# Patient Record
Sex: Male | Born: 1950 | Race: White | Hispanic: No | Smoking: Never smoker
Health system: Southern US, Community
[De-identification: ages and names within clinical notes are randomized; demographics above are authoritative.]

## PROBLEM LIST (undated history)

## (undated) DIAGNOSIS — M109 Gout, unspecified: Secondary | ICD-10-CM

## (undated) DIAGNOSIS — I10 Essential (primary) hypertension: Secondary | ICD-10-CM

## (undated) DIAGNOSIS — I4891 Unspecified atrial fibrillation: Secondary | ICD-10-CM

## (undated) DIAGNOSIS — K5900 Constipation, unspecified: Secondary | ICD-10-CM

## (undated) HISTORY — PX: HIP SURGERY: SHX245

## (undated) HISTORY — PX: APPENDECTOMY: SHX54

---

## 1997-12-26 ENCOUNTER — Encounter: Payer: Self-pay | Admitting: Emergency Medicine

## 1997-12-26 ENCOUNTER — Observation Stay (HOSPITAL_COMMUNITY): Admission: EM | Admit: 1997-12-26 | Discharge: 1997-12-26 | Payer: Self-pay | Admitting: Emergency Medicine

## 1998-07-10 ENCOUNTER — Encounter: Payer: Self-pay | Admitting: Radiology

## 1998-07-10 ENCOUNTER — Encounter: Payer: Self-pay | Admitting: Orthopedic Surgery

## 1998-07-10 ENCOUNTER — Observation Stay (HOSPITAL_COMMUNITY): Admission: EM | Admit: 1998-07-10 | Discharge: 1998-07-11 | Payer: Self-pay | Admitting: Emergency Medicine

## 1998-07-11 ENCOUNTER — Encounter: Payer: Self-pay | Admitting: Emergency Medicine

## 1999-01-07 ENCOUNTER — Encounter: Payer: Self-pay | Admitting: Orthopedic Surgery

## 1999-01-14 ENCOUNTER — Inpatient Hospital Stay (HOSPITAL_COMMUNITY): Admission: RE | Admit: 1999-01-14 | Discharge: 1999-01-16 | Payer: Self-pay | Admitting: Orthopedic Surgery

## 1999-01-14 ENCOUNTER — Encounter: Payer: Self-pay | Admitting: Orthopedic Surgery

## 1999-04-22 ENCOUNTER — Encounter: Payer: Self-pay | Admitting: Orthopedic Surgery

## 1999-04-29 ENCOUNTER — Inpatient Hospital Stay (HOSPITAL_COMMUNITY): Admission: RE | Admit: 1999-04-29 | Discharge: 1999-04-30 | Payer: Self-pay | Admitting: Orthopedic Surgery

## 2000-11-14 ENCOUNTER — Encounter: Payer: Self-pay | Admitting: Orthopedic Surgery

## 2000-11-14 ENCOUNTER — Encounter: Payer: Self-pay | Admitting: Emergency Medicine

## 2000-11-14 ENCOUNTER — Emergency Department (HOSPITAL_COMMUNITY): Admission: EM | Admit: 2000-11-14 | Discharge: 2000-11-14 | Payer: Self-pay | Admitting: Emergency Medicine

## 2003-06-26 ENCOUNTER — Encounter: Admission: RE | Admit: 2003-06-26 | Discharge: 2003-06-26 | Payer: Self-pay | Admitting: Emergency Medicine

## 2003-07-08 ENCOUNTER — Encounter: Admission: RE | Admit: 2003-07-08 | Discharge: 2003-07-08 | Payer: Self-pay | Admitting: Emergency Medicine

## 2004-02-03 ENCOUNTER — Ambulatory Visit (HOSPITAL_COMMUNITY): Admission: RE | Admit: 2004-02-03 | Discharge: 2004-02-03 | Payer: Self-pay | Admitting: *Deleted

## 2004-02-28 ENCOUNTER — Ambulatory Visit: Payer: Self-pay | Admitting: Internal Medicine

## 2004-09-09 ENCOUNTER — Ambulatory Visit (HOSPITAL_COMMUNITY): Admission: EM | Admit: 2004-09-09 | Discharge: 2004-09-09 | Payer: Self-pay | Admitting: Emergency Medicine

## 2006-01-25 ENCOUNTER — Encounter: Admission: RE | Admit: 2006-01-25 | Discharge: 2006-01-25 | Payer: Self-pay | Admitting: Emergency Medicine

## 2006-03-08 ENCOUNTER — Encounter: Admission: RE | Admit: 2006-03-08 | Discharge: 2006-03-08 | Payer: Self-pay | Admitting: Interventional Radiology

## 2006-03-15 ENCOUNTER — Encounter: Admission: RE | Admit: 2006-03-15 | Discharge: 2006-03-15 | Payer: Self-pay | Admitting: Interventional Radiology

## 2006-04-01 ENCOUNTER — Encounter: Admission: RE | Admit: 2006-04-01 | Discharge: 2006-04-01 | Payer: Self-pay | Admitting: Interventional Radiology

## 2007-03-21 ENCOUNTER — Ambulatory Visit: Payer: Self-pay | Admitting: Internal Medicine

## 2007-03-28 ENCOUNTER — Ambulatory Visit: Payer: Self-pay

## 2007-03-28 ENCOUNTER — Encounter: Payer: Self-pay | Admitting: Internal Medicine

## 2007-04-28 ENCOUNTER — Ambulatory Visit: Payer: Self-pay | Admitting: Internal Medicine

## 2008-08-09 DIAGNOSIS — I517 Cardiomegaly: Secondary | ICD-10-CM

## 2008-08-09 DIAGNOSIS — I10 Essential (primary) hypertension: Secondary | ICD-10-CM | POA: Insufficient documentation

## 2008-08-09 DIAGNOSIS — I4891 Unspecified atrial fibrillation: Secondary | ICD-10-CM | POA: Insufficient documentation

## 2008-08-22 ENCOUNTER — Ambulatory Visit: Payer: Self-pay | Admitting: Internal Medicine

## 2009-01-27 ENCOUNTER — Telehealth: Payer: Self-pay | Admitting: Internal Medicine

## 2009-02-05 ENCOUNTER — Ambulatory Visit: Payer: Self-pay | Admitting: Cardiovascular Disease

## 2009-02-05 ENCOUNTER — Ambulatory Visit: Payer: Self-pay | Admitting: Internal Medicine

## 2009-02-05 ENCOUNTER — Ambulatory Visit (HOSPITAL_COMMUNITY): Admission: RE | Admit: 2009-02-05 | Discharge: 2009-02-05 | Payer: Self-pay | Admitting: Internal Medicine

## 2009-02-05 ENCOUNTER — Ambulatory Visit: Payer: Self-pay | Admitting: Cardiology

## 2009-02-05 ENCOUNTER — Encounter: Payer: Self-pay | Admitting: Internal Medicine

## 2009-02-05 ENCOUNTER — Telehealth (INDEPENDENT_AMBULATORY_CARE_PROVIDER_SITE_OTHER): Payer: Self-pay | Admitting: Radiology

## 2009-02-05 ENCOUNTER — Ambulatory Visit: Payer: Self-pay

## 2009-02-05 DIAGNOSIS — R0989 Other specified symptoms and signs involving the circulatory and respiratory systems: Secondary | ICD-10-CM

## 2009-02-05 DIAGNOSIS — R0609 Other forms of dyspnea: Secondary | ICD-10-CM

## 2009-02-05 DIAGNOSIS — R0602 Shortness of breath: Secondary | ICD-10-CM

## 2009-02-06 ENCOUNTER — Ambulatory Visit: Payer: Self-pay

## 2009-02-06 ENCOUNTER — Ambulatory Visit: Payer: Self-pay | Admitting: Internal Medicine

## 2009-02-06 ENCOUNTER — Encounter (HOSPITAL_COMMUNITY): Admission: RE | Admit: 2009-02-06 | Discharge: 2009-04-01 | Payer: Self-pay | Admitting: Internal Medicine

## 2009-02-11 ENCOUNTER — Telehealth: Payer: Self-pay | Admitting: Internal Medicine

## 2009-02-13 ENCOUNTER — Telehealth: Payer: Self-pay | Admitting: Internal Medicine

## 2009-02-18 ENCOUNTER — Telehealth (INDEPENDENT_AMBULATORY_CARE_PROVIDER_SITE_OTHER): Payer: Self-pay | Admitting: *Deleted

## 2009-02-24 ENCOUNTER — Encounter: Payer: Self-pay | Admitting: Cardiology

## 2009-03-03 ENCOUNTER — Encounter: Payer: Self-pay | Admitting: Internal Medicine

## 2009-03-19 ENCOUNTER — Telehealth: Payer: Self-pay | Admitting: Internal Medicine

## 2009-03-20 ENCOUNTER — Ambulatory Visit (HOSPITAL_COMMUNITY): Admission: RE | Admit: 2009-03-20 | Discharge: 2009-03-20 | Payer: Self-pay | Admitting: Internal Medicine

## 2009-03-24 ENCOUNTER — Ambulatory Visit (HOSPITAL_COMMUNITY): Admission: RE | Admit: 2009-03-24 | Discharge: 2009-03-24 | Payer: Self-pay | Admitting: Internal Medicine

## 2009-03-24 ENCOUNTER — Ambulatory Visit: Payer: Self-pay | Admitting: Internal Medicine

## 2009-03-24 ENCOUNTER — Encounter: Payer: Self-pay | Admitting: Internal Medicine

## 2009-04-01 ENCOUNTER — Telehealth: Payer: Self-pay | Admitting: Internal Medicine

## 2009-04-07 ENCOUNTER — Ambulatory Visit: Payer: Self-pay | Admitting: Internal Medicine

## 2009-04-08 ENCOUNTER — Telehealth (INDEPENDENT_AMBULATORY_CARE_PROVIDER_SITE_OTHER): Payer: Self-pay | Admitting: *Deleted

## 2009-04-23 ENCOUNTER — Encounter: Payer: Self-pay | Admitting: Internal Medicine

## 2009-04-23 ENCOUNTER — Ambulatory Visit: Payer: Self-pay | Admitting: Internal Medicine

## 2010-02-05 ENCOUNTER — Encounter (INDEPENDENT_AMBULATORY_CARE_PROVIDER_SITE_OTHER): Payer: Self-pay | Admitting: *Deleted

## 2010-05-03 LAB — CONVERTED CEMR LAB
BUN: 16 mg/dL (ref 6–23)
CO2: 26 meq/L (ref 19–32)
Calcium: 10.1 mg/dL (ref 8.4–10.5)
Chloride: 106 meq/L (ref 96–112)
Creatinine, Ser: 0.89 mg/dL (ref 0.40–1.50)
Pro B Natriuretic peptide (BNP): 198 pg/mL — ABNORMAL HIGH (ref 0.0–100.0)

## 2010-05-05 NOTE — Miscellaneous (Signed)
  Clinical Lists Changes  Medications: Rx of LOSARTAN POTASSIUM 50 MG TABS (LOSARTAN POTASSIUM) once daily;  #90 x 4;  Signed;  Entered by: Duncan Dull, RN, BSN;  Authorized by: Nathen May, MD, [facility];  Method used: Electronically to [facility]*, , ,   , Ph: [phone], Fax: [phone]    Prescriptions: LOSARTAN POTASSIUM 50 MG TABS (LOSARTAN POTASSIUM) once daily  #90 x 4   Entered by:   Duncan Dull, RN, BSN   Authorized by:   Nathen May, MD, [facility]   Signed by:   Duncan Dull, RN, BSN on 04/07/2009   Method used:   Electronically to        [facility]* (mail-order)             ,          Ph: [phone]       Fax: [phone]   RxID:   [id number]

## 2010-05-05 NOTE — Assessment & Plan Note (Signed)
Summary: **SK** EKG (786.05)/ MMR  Nurse Visit   Vital Signs:  Patient profile:   61 year old male Weight:      233 pounds BMI:     31.71 Pulse rhythm:   irregular  Impression & Recommendations:  Problem # 1:  FIBRILLATION, ATRIAL (ICD-427.31)  His updated medication list for this problem includes:    Flecainide Acetate 100 Mg Tabs (Flecainide acetate) .Marland Kitchen... Take two times a day starting 3 days prior to cardioversion Pt here for EKG to see if he is still in rhythm after his cardioversion. He is c/o SOB and has been since the day after the cardioversion. EKG done. Confirmed pt is back in AF. Plan is to s/w Dr. Graciela Husbands today. I will call him back with his suggestions. For now he will stay on his Pradaxa and Flecainide.   Allergies: No Known Drug Allergies

## 2010-05-05 NOTE — Assessment & Plan Note (Signed)
Summary: [facility]   Visit Type:  Follow-up Primary Provider:  Clarisse Gouge   History of Present Illness: Colin [name] is a pleasant 60 yo WM with a history of persistent atrial fibrillation who presents today for further consideration of therapies for afib.  He was initially diagnosed with atrial fibrillation 6-8 years ago.  He presented with a gout flare and was found to have afib on a routine ekg.  He reports being minimally symptomatic with afib at that time.  He was cardioverted initially 4 years ago, but returned to afib within several hours. He was evaluated by Dr Graciela Husbands and felt to be a poor candidate for rhythm control strategies due to longstanding afib and enlarged LA.  He was therefore treated successfully with a rate control strategy.  He reports mild SOB (primarily when bending) over the past few months.  Most recently, he was cardioverted 03/24/09.  He feels that he returned to afib within 24 hours.  He is unaware of any significant improvement in symptoms at that time.  He has failed medical therapy with flecainide but has not tried any other antiarrhythmic medicines.  Current Medications (verified): 1)  Allopurinol 100 Mg Tabs (Allopurinol) .... Take One Tablet Every Day For 2 Weeks, Then Take 2 Tablets Every Day For 2 Weeks 2)  Amlodipine .... Pt Not Taking 3)  Pradaxa 150 Mg .... Take One Tablet By Mouth Daily. 4)  Lisinopril 20 Mg Tabs (Lisinopril) .... Take One Tablet By Mouth Daily 5)  Indomethacin 50 Mg Caps (Indomethacin) .... As Needed For Gout  Allergies (verified): No Known Drug Allergies  Past History:  Past Medical History:  1. Atrial fibrillation -- persistent       moderate LA enlargment  2. Gout  3. Hypertension.   Past Surgical History: Reviewed history from 08/09/2008 and no changes required. Notable for bilateral hip replacements and revisions  in the last 15 to 16 years.  Appendectomy  Family History: denies FH of afib, though father had  "irregular heart beat in 46s"  Social History: Single --lives with girlfriend, no children in [place], [place] Tobacco Use - No.  Alcohol Use - yes--12 pack per day range, and some liquor Drug Use - no  Review of Systems       All systems are reviewed and negative except as listed in the HPI.   Vital Signs:  Patient profile:   60 year old male Height:      72 inches Weight:      234 pounds BMI:     31.85 Pulse rate:   78 / minute BP sitting:   120 / 82  Vitals Entered By: Laurance Flatten CMA (April 23, 2009 3:24 PM)  Physical Exam  General:  Well developed, well nourished, in no acute distress. Head:  normocephalic and atraumatic Eyes:  PERRLA/EOM intact; conjunctiva and lids normal. Nose:  no deformity, discharge, inflammation, or lesions Mouth:  Teeth, gums and palate normal. Oral mucosa normal. Neck:  Neck supple, no JVD. No masses, thyromegaly or abnormal cervical nodes. Lungs:  Clear bilaterally to auscultation and percussion. Heart:  irregularly irregular rhythm, no m/r/g Abdomen:  Bowel sounds positive; abdomen soft and non-tender without masses, organomegaly, or hernias noted. No hepatosplenomegaly. Msk:  Back normal, normal gait. Muscle strength and tone normal. Pulses:  pulses normal in all 4 extremities Extremities:  No clubbing or cyanosis. Neurologic:  Alert and oriented x 3. Skin:  Intact without lesions or rashes. Cervical Nodes:  no significant adenopathy  Psych:  Normal affect.   Impression & Recommendations:  Problem # 1:  FIBRILLATION, ATRIAL (ICD-427.31) The patient has longstanding persistent atrial fibrillation, with at least moderate left atrial enlargement and minimal symptoms of afib.  He has recently failed medical therapy with flecainide but has been resistant to try other antiarrhythmic medicines.  His CHADS2 score is 1.  Therapeutic strategies for afib including medicine and ablation were discussed in detail with the patient today. Risk, benefits,  and alternatives to EP study and radiofrequency ablation for afib were also discussed in detail today. Given longstanding persistent afib with at least moderate LA enlargement, he is aware that anticipated success rates from catheter ablation would be 50-60% and would likely require multiple ablation procedures.  He drinks heavy ETOH.  He is aware that a long term rhythm control strategy would also require significant ETOH reduction/ lifestyle modification. At this point, the patient would prefer to continue rate control and defer catheter ablation.  He also requests to stop pradaxa and return to ASA 325mg  daily.  He understands that aspirin would not provide the same stroke prevention as either warfarin or pradaxa but is clear in his decision.  Given elevated ETOH consumption, I am concerned about his bleeding risks longterm. I have advised ETOH cessation today. The patient will continue to follow with Dr Graciela Husbands.  Problem # 2:  ESSENTIAL HYPERTENSION, BENIGN (ICD-401.1) stable, no changes today The following medications were removed from the medication list:    Losartan Potassium 50 Mg Tabs (Losartan potassium) ..... Once daily His updated medication list for this problem includes:    Lisinopril 20 Mg Tabs (Lisinopril) .Marland Kitchen... Take one tablet by mouth daily  Problem # 3:  ATRIAL ENLARGEMENT, LEFT (ICD-429.3) stable as above  Patient Instructions: 1)  Your physician recommends that you schedule a follow-up appointment in: 3 months with Dr Graciela Husbands 2)  Your physician has recommended you make the following change in your medication: stop Pradaxa and Flecainide and start Aspirin 325mg  daily

## 2010-05-05 NOTE — Progress Notes (Signed)
  Phone Note Outgoing Call Call back at Work Phone [phone]   Call placed by: Duncan Dull, RN, BSN,  April 08, 2009 9:23 AM Call placed to: Patient Summary of Call: Called patient and left message on machine To discuss his options per Dr. Graciela Husbands. He needs to make a f/u appt to see Dr. Johney Frame  to discuss the AF Ablation and stay on Pradaxa until then or he can stop Pradaxa, start back on Coumadin now and f/u with Dr. Johney Frame. Either way is fine.  Duncan Dull, RN, BSN  April 08, 2009 9:27 AM  Initial call taken by: Duncan Dull, RN, BSN,  April 08, 2009 9:27 AM  Follow-up for Phone Call        returning call, please call back @ [phone], Migdalia Dk  April 08, 2009 2:59 PM   Additional Follow-up for Phone Call Additional follow up Details #1::        S/W Pt and he will come see Dr. Johney Frame to discuss ablation. He will discuss switching back to Coumadin at that time. He wants the ablation done in Feb or March at the latest.  Additional Follow-up by: Duncan Dull, RN, BSN,  April 16, 2009 3:48 PM

## 2010-05-05 NOTE — Letter (Signed)
Summary: Appointment - Missed  [facility], Main Office  [address]   [place], [place] [address]   Phone: [phone]  Fax: [phone]     February 05, 2010 MRN: [id number]   Colin [name] [address] [place], [place]  [address]   Dear Mr. ROSNER,  Our records indicate you missed your appointment on 04-18-2009 with Dr. Graciela Husbands       .                                    It is very important that we reach you to reschedule this appointment. We look forward to participating in your health care needs. Please contact us at the number listed above at your earliest convenience to reschedule this appointment.     Sincerely,       Lorne Skeens  [facility] Scheduling Team

## 2010-07-06 LAB — MAGNESIUM: Magnesium: 2 mg/dL (ref 1.5–2.5)

## 2010-07-06 LAB — CBC
MCHC: 34.6 g/dL (ref 30.0–36.0)
MCV: 96.1 fL (ref 78.0–100.0)
Platelets: 228 10*3/uL (ref 150–400)

## 2010-07-06 LAB — BASIC METABOLIC PANEL
BUN: 14 mg/dL (ref 6–23)
CO2: 25 mEq/L (ref 19–32)
Calcium: 9.7 mg/dL (ref 8.4–10.5)
Chloride: 107 mEq/L (ref 96–112)
Creatinine, Ser: 0.81 mg/dL (ref 0.4–1.5)

## 2010-08-18 NOTE — Letter (Signed)
April 28, 2007    Reuben Likes, M.D.  [address]  [place], [place] [address]   RE:  [name], Colin  MRN:  [id number]  /  DOB:  1951-03-14   Dear Theodoro Grist:   It was a pleasure talking to you the other day.  Sierra Bissonette came back  in today for followup for his atrial fibrillation and hypertension.  His  echo shows his EF is still mildly depressed, but stable in the 40% to  50% range.  His blood pressure today was considerably more elevated at  155/95.  His pulse was 75.  His lungs were clear.  Heart sounds were  regular.  The extremities were without edema.   IMPRESSION:  1. Atrial fibrillation -- permanent.  2. Modest depression of left ventricular systolic function with      moderate left atrial enlargement at 53 mm.  3. Hypertension.   Theodoro Grist, [name] and I had a lengthy conversation today regarding the  role of ACE inhibitor therapy, both for LV dysfunction progression  prevention, i.e., based on the SAVE prevention data base.  Although this  study applies to myocardial infarction, I think the premise of the study  is still probably extrapolatable into a gentleman like [name].  In  addition, his blood pressure is elevated again here today and  considerably more than it was.  He has a blood pressure machine at home.  He will plan to record his blood pressures at home.   He is reluctant at this point to begin therapy with an ACE inhibitor,  but I told him regardless of what his blood pressure is, I would favor  its initiation based on the SAVE prevention data.  I have also told him  that if he were to start lisinopril 10 mg, a prescription for which I  have given him, he would need to get his BMET checked about 3 weeks  afterwards and I actually stapled these 2 messages together.   I have asked that he follow up with me in about 16 months, which would  be about 6 months after you see him in 10 months' time.   I also have asked him to call us when he starts the  lisinopril so that  we can get a BMET ordered and have that faxed to your office.   Thanks very much for allowing [place] to participate in his care.    Sincerely,      Duke Salvia, MD, [facility]  Electronically Signed    SCK/MedQ  DD: 04/28/2007  DT: 04/28/2007  Job #: [id number]

## 2010-08-18 NOTE — Letter (Signed)
March 21, 2007    Reuben Likes, M.D.  [address]  [place], [place] [address]   RE:  Colin [name], Colin [name]  MRN:  [id number]  /  DOB:  1950/10/01   Dear Onalee Hua:   I hope you and your family have a merry Christmas.  My prayers will be  with you.   Thank you for asking me to see Mr. Kinney.  He is a gentleman whom I saw  3 years ago for Candace Cruise for his then persistent, and now permanent  atrial fibrillation.   He happened to be found in atrial fibrillation about 3 years ago when he  went for hip surgery.  Cardioversion was attempted.  It failed to hold  and he was, as best we could tell at that point, asymptomatic.  Notably,  his ejection fraction by echo was about 45%.   He and I met at that time and the decision was to continue his current  therapies, as there were no data that treatment of asymptomatic atrial  fibrillation would have a benefit on his life, and that his  thromboembolic risk potential was felt to be very low.  Hence  anticoagulation was not recommended.   In large part, nothing has changed.  He remains very active.  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  He lifts weights 3 times a week and he does  have some shortness of breath when he first gets up in the morning and  ties his shoes, but other than that, he has no limitations.   He does not have nocturnal dyspnea, orthopnea, or peripheral edema.   There has been no syncope or pre-syncope.   His cardiac risk factors are negative for hypertension, diabetes, family  history, cigarettes, or dyslipidemia.   PAST MEDICAL HISTORY:  In addition to the above, is notable for  tophaceous gout and allergies.   SURGICAL HISTORY:  Notable for bilateral hip replacements and revisions  in the last 15 to 16 years.   He is status post appendectomy.   SOCIAL HISTORY:  He has no children.  He is single.  He lives with his  girlfriend.  He does not use cigarettes or recreational drugs.  He does  use alcohol  somewhere in the 12-pack per day range plus some liquor.  This occurs in the interlude between his getting of work and his eating  supper about 7 to 8:00.  He says he does not drink after that.   He currently takes no medications.  He has no known drug allergies.   REVIEW OF SYSTEMS:  As noted previously.   EXAMINATION:  His blood pressure is borderline at 134/94, his pulse was  80, his weight was 230, which has been stable over the last 3 years.  HEENT:  Demonstrated no icterus or xanthoma.  The neck veins were flat.  The carotids are brisk and full bilaterally without bruits.  The back was without kyphosis or scoliosis.  LUNGS:  Clear.  HEART:  Sounds were irregular without murmurs or gallops.  ABDOMEN:  Soft with active bowel sounds without midline pulsation or  hepatomegaly.  Femoral pulses were 2+.  Distal pulses are intact.  There is no  cyanosis, clubbing, or edema.  NEUROLOGIC:  Grossly normal.  SKIN:  Warm and dry.   Electrocardiogram dated today demonstrated atrial fibrillation.   IMPRESSION:  1. Atrial fibrillation - permanent.  2. Thromboembolic risk factors notable for borderline blood pressure.  3. Previously mildly abnormal left  ventricular function.   Theodoro Grist, there are a couple of issues as relates to Mr. Amero atrial  fibrillation.  It is now permanent.  He had significant left atrial  enlargement even a number of years ago, and the likelihood of him being  able to maintain sinus rhythm is quite low.  In the event that he is  asymptomatic and there are no cardiovascular consequences for his atrial  fibrillation, I think doing nothing as it relates to his rhythm is  reasonable.  We anticipate over the next year or two, in randomized  control trials, looking at ablation versus non-ablation therapy for  atrial fibrillation and the preliminary pilot studies have suggested, at  least relative to medical controls, that there may be a survival benefit  in patients who  undergo ablation.  If that turns out to be confirmed,  then ablation for this gentleman who is otherwise asymptomatic, might be  worth pursuing.  The other issue would be whether his left ventricular  dysfunction has changed at all, and to that end, we will plan to get a  2D echo.  If we were to find such a deterioration, medical therapies  would be appropriate, specifically ACE inhibitors and beta blockers to  protect him from further left ventricular function deterioration.   I am also concerned about his borderline blood pressure.  If this  continues to be an issue, he may well need therapy for it, in which case  I would recommend an ARB as well as, at that point, considering him for  aspirin for anticoagulation for his atrial fibrillation.   We will plan to see him again in a year and I will follow up with him  following his echo.  Thank you very much for the consultation.    Sincerely,      Duke Salvia, MD, [facility]  Electronically Signed    SCK/MedQ  DD: 03/21/2007  DT: 03/21/2007  Job #: [id number]

## 2010-08-21 NOTE — Op Note (Signed)
NAME:  Colin [name], Colin [name] NO.:  [id number]   MEDICAL RECORD NO.:  [id number]          PATIENT TYPE:  EMS   LOCATION:  ED                           [facility]ACILITY[facility]  [facility]   PHYSICIAN:  Kerrin Champagne, M.D.   DATE OF BIRTH:  1950/06/21   DATE OF PROCEDURE:  09/09/2004  DATE OF DISCHARGE:                                 OPERATIVE REPORT   PREOPERATIVE DIAGNOSIS:  Closed right total hip arthroplasty dislocation,  superior and posterior dislocation position.   POSTOPERATIVE DIAGNOSIS:  Closed right total hip arthroplasty dislocation,  superior and posterior dislocation position.   PROCEDURE:  Closed reduction of right posterior superior total hip  arthroplasty dislocation.   SURGEON:  Dr. Vira Browns.   ASSISTANT:  None.   ANESTHESIA:  GOT, Dr. Carney Living.   ESTIMATED BLOOD LOSS:  0 cc.   DRAINS:  None.   BRIEF CLINICAL HISTORY:  The patient is a 60 year old male who has undergone  previous total hip arthroplasty in 1991 by Dr. Vear Clock and multiple  dislocations. Eventually in 2000, underwent a revision hip surgery by Dr.  Lequita Halt.  He had dislocation of his right total hip in 2002 and subsequently  did well up until three weeks ago at which time he dislocated his hip, three  weeks ago while trying to change bandages on his right foot. Today, he  presents again after having attempted to change bandages on his right foot,  dislocating his right total hip arthroplasty.  He was seen in the emergency  room, has a history of atrial fibrillation and is being treated medically  with Lisinopril.   The patient has had some alcohol tonight as well as Percocet. This is of  concern, so a general anesthesia with intubation was felt to be necessary.   DESCRIPTION OF PROCEDURE:  After adequate general anesthesia, the patient on  the stretcher from the emergency room, the right leg was brought into 90  degrees, 90 degrees of flexion, slight internal rotation. The hip was able  to be reduced over the posterior lip of the acetabulum into the socket, with  reduction of length to normal lengths. External rotation slightly increased  on the right side greater than the left. Range of motion of the hip shows  flexion to 90 degrees without evidence of the tendency to dislocate.  External rotation about 60-70 degrees, internal rotation about 20-25  degrees. A plain radiograph obtained following reduction demonstrated the  right total hip arthroplasty concentrically reduced. No abnormalities noted.   IMPRESSION:  Right total hip arthroplasty dislocated, superior posterior,  probably related to position during dislocation but not necessarily related  instability problems. The patient who is taking medications and alcohol at  the same time.   PLAN:  This patient had reduction done and he will be discharged from the  emergency room as an outpatient to be seen back by Dr. Ollen Gross in the  morning for a regular appointment. He has dislocated twice in three weeks.  He needs to stay off of any alcohol while taking pain medication and cut  back on  his pain medication of Percocet one q.4-6 h. p.r.n. pain.  No  further two tablets.  The patient will then be scheduled to undergo further  treatment if necessary by Dr. Lequita Halt.  The patient was reactivated,  extubated, returned to the recovery room in satisfactory condition following  his reduction.       JEN/MEDQ  D:  09/09/2004  T:  09/10/2004  Job:  [id number]

## 2010-08-21 NOTE — Op Note (Signed)
NAME:  Colin [name], Colin [name] NO.:  [id number]   MEDICAL RECORD NO.:  [id number]          PATIENT TYPE:  OIB   LOCATION:  2899                         FACILITY:  [facility]   PHYSICIAN:  Meade Maw, M.D.    DATE OF BIRTH:  Jun 10, 1950   DATE OF PROCEDURE:  02/03/2004  DATE OF DISCHARGE:                                 OPERATIVE REPORT   REFERRING PHYSICIAN:  Reuben Likes, M.D.   INDICATION FOR PROCEDURE:  Atrial fibrillation.   Colin [name] is a pleasant 60 year old gentleman who presented to Dr. Lequita Halt for  further evaluation of olecranon bursa.  At this time he was noted to have  frequent PVCs and atrial fibrillation.  He subsequently was referred to me  for further evaluation.  Risks, benefits, and options of rate control versus  rhythm control were discussed with Colin [name].  He wished to proceed with rhythm  control.  Of note, his transthoracic echo revealed a question of possible  apical hypertrophy, possible questionable atrial thrombus; therefore, he was  referred for an MRI of the heart for further evaluation.  He was noted to  have mild four-chamber dilatation with EF of 54%.  There was no evidence of  thrombus or apical hypertrophy.   PROCEDURE:  Anesthesia support was obtained.  He was given 500 mg of sodium  pentothal.  He was electively cardioverted to sinus rhythm with 120 joules  biphasic cardioversion.   FINAL IMPRESSION:  Successful cardioversion of atrial fibrillation to sinus  rhythm.   Will continue with Coumadin for an additional four weeks to maintain an INR  of 2-3.  Will not anticipate antiarrhythmic therapy at this time.      Hele   HP/MEDQ  D:  02/03/2004  T:  02/03/2004  Job:  [id number]

## 2010-08-21 NOTE — Op Note (Signed)
[facility]  Patient:    Colin [name], Colin [name]                       MRN: [id number] Proc. Date: 11/14/00 Adm. Date:  [id number] Disc. Date: [id number] Attending:  Donnetta Hutching                           Operative Report  PREOPERATIVE DIAGNOSIS:  Dislocated right total hip arthroplasty.  POSTOPERATIVE DIAGNOSIS:  Dislocated right total hip arthroplasty.  PROCEDURE:  Closed reduction of right total hip arthroplasty utilizing IV sedation.  HISTORY OF PRESENT ILLNESS:  Colin [name] is a 60 year old gentleman who is status post a right total hip arthroplasty with subsequent revision two years ago. He has done well until last night at which time he apparently squatted down while at home and sustained immediate pain in the right hip with foreshortening of the right lower extremity and inability to bear weight. He was brought to the [facility] emergency room this morning where x-rays showed a superior dislocation of the right total hip implant.  Preoperatively, Colin [name] was counselled on treatment options as well as risks versus benefits thereof. The possible complications of periprosthetic fracture as well as recurrent dislocation and neurovascular injury are reviewed. He understands and accepts and agrees.  DESCRIPTION OF PROCEDURE:  In the emergency room utilizing IV conscious sedation, the patient was appropriate relaxed. We performed a gentle reduction maneuver with longitudinal traction on the right lower extremity with slight flexion, adduction and internal rotation. A visible as well as palpable reduction of the hip was achieved. We then took the hip through a safe range of motion showing good stability. Subsequent AP x-ray of the hip showed the implant to be congruently aligned.  Discharge plan is for Colin [name] to be fitted with an abduction orthosis. He may be weightbearing as tolerated. We have reviewed total hip precautions. He will follow-up with Dr.  Lequita Halt in two to three weeks for repeat x-rays and reevaluation. DD:  11/14/00 TD:  11/14/00 Job: [id number]

## 2012-01-10 ENCOUNTER — Other Ambulatory Visit (HOSPITAL_COMMUNITY): Payer: Self-pay | Admitting: Orthopedic Surgery

## 2012-01-10 DIAGNOSIS — M25552 Pain in left hip: Secondary | ICD-10-CM

## 2012-01-19 ENCOUNTER — Encounter (HOSPITAL_COMMUNITY)
Admission: RE | Admit: 2012-01-19 | Discharge: 2012-01-19 | Disposition: A | Discharge status: Home / Self Care | Payer: BC Managed Care – PPO | Source: Ambulatory Visit | Attending: Orthopedic Surgery | Admitting: Orthopedic Surgery

## 2012-01-19 ENCOUNTER — Ambulatory Visit (HOSPITAL_COMMUNITY)
Admission: RE | Admit: 2012-01-19 | Discharge: 2012-01-19 | Disposition: A | Discharge status: Home / Self Care | Payer: BC Managed Care – PPO | Source: Ambulatory Visit | Attending: Orthopedic Surgery | Admitting: Orthopedic Surgery

## 2012-01-19 DIAGNOSIS — Z96649 Presence of unspecified artificial hip joint: Secondary | ICD-10-CM | POA: Insufficient documentation

## 2012-01-19 DIAGNOSIS — M25559 Pain in unspecified hip: Secondary | ICD-10-CM | POA: Insufficient documentation

## 2012-01-19 DIAGNOSIS — M25552 Pain in left hip: Secondary | ICD-10-CM

## 2012-01-19 MED ORDER — TECHNETIUM TC 99M MEDRONATE IV KIT
25.0000 | PACK | Freq: Once | INTRAVENOUS | Status: AC | PRN
  Administered 2012-01-19: 25 via INTRAVENOUS

## 2013-06-22 ENCOUNTER — Encounter (HOSPITAL_BASED_OUTPATIENT_CLINIC_OR_DEPARTMENT_OTHER): Payer: Self-pay | Admitting: Emergency Medicine

## 2013-06-22 ENCOUNTER — Emergency Department (HOSPITAL_BASED_OUTPATIENT_CLINIC_OR_DEPARTMENT_OTHER): Payer: BC Managed Care – PPO

## 2013-06-22 ENCOUNTER — Emergency Department (HOSPITAL_BASED_OUTPATIENT_CLINIC_OR_DEPARTMENT_OTHER)
Admission: EM | Admit: 2013-06-22 | Discharge: 2013-06-22 | Disposition: A | Discharge status: Home / Self Care | Payer: BC Managed Care – PPO | Attending: Emergency Medicine | Admitting: Emergency Medicine

## 2013-06-22 DIAGNOSIS — K5792 Diverticulitis of intestine, part unspecified, without perforation or abscess without bleeding: Secondary | ICD-10-CM

## 2013-06-22 DIAGNOSIS — I10 Essential (primary) hypertension: Secondary | ICD-10-CM | POA: Insufficient documentation

## 2013-06-22 DIAGNOSIS — M109 Gout, unspecified: Secondary | ICD-10-CM | POA: Insufficient documentation

## 2013-06-22 DIAGNOSIS — R350 Frequency of micturition: Secondary | ICD-10-CM | POA: Insufficient documentation

## 2013-06-22 DIAGNOSIS — K5732 Diverticulitis of large intestine without perforation or abscess without bleeding: Secondary | ICD-10-CM | POA: Insufficient documentation

## 2013-06-22 DIAGNOSIS — Z79899 Other long term (current) drug therapy: Secondary | ICD-10-CM | POA: Insufficient documentation

## 2013-06-22 HISTORY — DX: Constipation, unspecified: K59.00

## 2013-06-22 HISTORY — DX: Gout, unspecified: M10.9

## 2013-06-22 HISTORY — DX: Essential (primary) hypertension: I10

## 2013-06-22 HISTORY — DX: Unspecified atrial fibrillation: I48.91

## 2013-06-22 LAB — URINALYSIS, ROUTINE W REFLEX MICROSCOPIC
BILIRUBIN URINE: NEGATIVE
Glucose, UA: NEGATIVE mg/dL
HGB URINE DIPSTICK: NEGATIVE
KETONES UR: NEGATIVE mg/dL
Leukocytes, UA: NEGATIVE
NITRITE: NEGATIVE
PH: 6 (ref 5.0–8.0)
Protein, ur: NEGATIVE mg/dL
Specific Gravity, Urine: 1.021 (ref 1.005–1.030)
Urobilinogen, UA: 0.2 mg/dL (ref 0.0–1.0)

## 2013-06-22 LAB — CBC WITH DIFFERENTIAL/PLATELET
Basophils Absolute: 0.1 10*3/uL (ref 0.0–0.1)
Basophils Relative: 0 % (ref 0–1)
Eosinophils Absolute: 0.1 10*3/uL (ref 0.0–0.7)
Eosinophils Relative: 1 % (ref 0–5)
HCT: 42.3 % (ref 39.0–52.0)
HEMOGLOBIN: 14.3 g/dL (ref 13.0–17.0)
LYMPHS ABS: 2.2 10*3/uL (ref 0.7–4.0)
LYMPHS PCT: 19 % (ref 12–46)
MCH: 32.9 pg (ref 26.0–34.0)
MCHC: 33.8 g/dL (ref 30.0–36.0)
MCV: 97.2 fL (ref 78.0–100.0)
Monocytes Absolute: 1.5 10*3/uL — ABNORMAL HIGH (ref 0.1–1.0)
Monocytes Relative: 13 % — ABNORMAL HIGH (ref 3–12)
NEUTROS ABS: 7.6 10*3/uL (ref 1.7–7.7)
NEUTROS PCT: 67 % (ref 43–77)
Platelets: 266 10*3/uL (ref 150–400)
RBC: 4.35 MIL/uL (ref 4.22–5.81)
RDW: 11.6 % (ref 11.5–15.5)
WBC: 11.5 10*3/uL — AB (ref 4.0–10.5)

## 2013-06-22 LAB — COMPREHENSIVE METABOLIC PANEL
ALBUMIN: 4.2 g/dL (ref 3.5–5.2)
ALK PHOS: 72 U/L (ref 39–117)
ALT: 19 U/L (ref 0–53)
AST: 22 U/L (ref 0–37)
BILIRUBIN TOTAL: 0.8 mg/dL (ref 0.3–1.2)
BUN: 14 mg/dL (ref 6–23)
CHLORIDE: 98 meq/L (ref 96–112)
CO2: 25 mEq/L (ref 19–32)
Calcium: 10.2 mg/dL (ref 8.4–10.5)
Creatinine, Ser: 1 mg/dL (ref 0.50–1.35)
GFR calc Af Amer: >90 mL/min (ref >90)
GFR calc non Af Amer: 80 mL/min — ABNORMAL LOW (ref >90)
GLUCOSE: 97 mg/dL (ref 70–99)
POTASSIUM: 4.3 meq/L (ref 3.7–5.3)
SODIUM: 139 meq/L (ref 137–147)
Total Protein: 7.5 g/dL (ref 6.0–8.3)

## 2013-06-22 LAB — LIPASE, BLOOD: LIPASE: 36 U/L (ref 11–59)

## 2013-06-22 LAB — OCCULT BLOOD X 1 CARD TO LAB, STOOL: Fecal Occult Bld: NEGATIVE

## 2013-06-22 MED ORDER — IOHEXOL 300 MG/ML  SOLN
50.0000 mL | Freq: Once | INTRAMUSCULAR | Status: AC | PRN
  Administered 2013-06-22: 50 mL via ORAL

## 2013-06-22 MED ORDER — OXYCODONE-ACETAMINOPHEN 5-325 MG PO TABS: 1.0000 | ORAL_TABLET | Freq: Four times a day (QID) | ORAL | Status: AC | PRN

## 2013-06-22 MED ORDER — HYDROMORPHONE HCL PF 1 MG/ML IJ SOLN
1.0000 mg | INTRAMUSCULAR | Status: AC
  Administered 2013-06-22: 1 mg via INTRAVENOUS
  Filled 2013-06-22: qty 1

## 2013-06-22 MED ORDER — METRONIDAZOLE 500 MG PO TABS
500.0000 mg | ORAL_TABLET | Freq: Once | ORAL | Status: AC
  Administered 2013-06-22: 500 mg via ORAL
  Filled 2013-06-22: qty 1

## 2013-06-22 MED ORDER — CIPROFLOXACIN HCL 500 MG PO TABS
500.0000 mg | ORAL_TABLET | Freq: Once | ORAL | Status: AC
  Administered 2013-06-22: 500 mg via ORAL
  Filled 2013-06-22: qty 1

## 2013-06-22 MED ORDER — POLYETHYLENE GLYCOL 3350 17 G PO PACK: 17.0000 g | PACK | Freq: Every day | ORAL | Status: AC

## 2013-06-22 MED ORDER — METRONIDAZOLE 500 MG PO TABS: 500.0000 mg | ORAL_TABLET | Freq: Three times a day (TID) | ORAL | Status: AC

## 2013-06-22 MED ORDER — IOHEXOL 300 MG/ML  SOLN
100.0000 mL | Freq: Once | INTRAMUSCULAR | Status: AC | PRN
  Administered 2013-06-22: 100 mL via INTRAVENOUS

## 2013-06-22 MED ORDER — CIPROFLOXACIN HCL 500 MG PO TABS: 500.0000 mg | ORAL_TABLET | Freq: Two times a day (BID) | ORAL | Status: AC

## 2013-06-22 NOTE — ED Provider Notes (Signed)
CSN: [id number]     Arrival date & time 06/22/13  1744 History  This chart was scribed for Junius Argyle, MD by Blanchard Kelch, ED Scribe. The patient was seen in room MH09/MH09. Patient's care was started at 6:44 PM.      Chief Complaint  Patient presents with  . Abdominal Pain  . Urinary Frequency      Patient is a 63 y.o. male presenting with abdominal pain and frequency. The history is provided by the patient. No language interpreter was used.  Abdominal Pain Pain location:  Generalized Pain quality: aching and sharp   Duration:  5 days Timing:  Intermittent Progression:  Unchanged Chronicity:  New Context: previous surgery   Relieved by:  Nothing Ineffective treatments:  Bowel activity Associated symptoms: constipation   Associated symptoms: no cough, no fever, no hematuria and no vomiting   Urinary Frequency Associated symptoms include abdominal pain.    HPI Comments: ANTHEM FRAZER is a 63 y.o. male who presents to the Emergency Department complaining of intermittent generalized abdominal pain that began four days ago. He describes the pain as aching and occasionally sharp in the LLQ. He reports associated constipation and abdominal distention. His last normal BM was a week ago. He also states that he is not passing gas as much as usual. He states that he took stool softeners four and three days ago as well as Gas-X without relief. He denies a history of constipation. He denies vomiting, fever, diarrhea or blood in stool. He states he was seen at [facility] prior to arrival and they referred him here due to an elevated white count. He states the x-ray and urinalysis performed were normal. He has a past surgical history of hip surgery and appendectomy. He denies a history of hernias. He denies any allergy to contrast that he knows of.   Past Medical History  Diagnosis Date  . Constipation   . Hypertension   . Atrial fibrillation   . Gout    Past Surgical History  Procedure  Laterality Date  . Hip surgery    . Appendectomy     No family history on file. History  Substance Use Topics  . Smoking status: Never Smoker   . Smokeless tobacco: Not on file  . Alcohol Use: Yes     Comment: 6 pack daily    Review of Systems  Constitutional: Negative for fever.  HENT: Negative for drooling.   Eyes: Negative for discharge.  Respiratory: Negative for cough.   Cardiovascular: Negative for leg swelling.  Gastrointestinal: Positive for abdominal pain, constipation and abdominal distention. Negative for vomiting.  Endocrine: Negative for polyuria.  Genitourinary: Negative for hematuria.  Musculoskeletal: Negative for gait problem.  Skin: Negative for rash.  Allergic/Immunologic: Negative for immunocompromised state.  Neurological: Negative for speech difficulty.  Hematological: Negative for adenopathy.  Psychiatric/Behavioral: Negative for confusion.  All other systems reviewed and are negative.      Allergies  Review of patient's allergies indicates no known allergies.  Home Medications   Current Outpatient Rx  Name  Route  Sig  Dispense  Refill  . ALLOPURINOL PO   Oral   Take by mouth.         Marland Kitchen UNKNOWN TO PATIENT                Triage Vitals: BP 135/96  Pulse 88  Temp(Src) 98.2 F (36.8 C) (Oral)  SpO2 98%  Physical Exam  Nursing note and vitals reviewed. Constitutional: He  is oriented to person, place, and time. He appears well-developed and well-nourished. No distress.  HENT:  Head: Normocephalic and atraumatic.  Mouth/Throat: Oropharynx is clear and moist. No oropharyngeal exudate.  Eyes: Conjunctivae and EOM are normal. Pupils are equal, round, and reactive to light.  Neck: Normal range of motion. Neck supple. No tracheal deviation present.  Cardiovascular: Normal rate, regular rhythm and normal heart sounds.  Exam reveals no gallop and no friction rub.   No murmur heard. Pulmonary/Chest: Effort normal and breath sounds normal.  No respiratory distress. He has no wheezes. He has no rales.  Abdominal: Soft. Bowel sounds are normal. He exhibits distension (mild to moderate). There is tenderness. There is no rebound and no guarding. Hernia confirmed negative in the right inguinal area and confirmed negative in the left inguinal area.  Mild diffuse tenderness which seems to be worse in LLQ.  Genitourinary: Rectum normal, testes normal and penis normal.  Normal rectal exam. Empty rectal vault. Coleson stool.   Musculoskeletal: Normal range of motion.  Lymphadenopathy:    He has no cervical adenopathy.       Right: No inguinal adenopathy present.       Left: No inguinal adenopathy present.  Neurological: He is alert and oriented to person, place, and time.  Skin: Skin is warm and dry. No rash noted.  Psychiatric: He has a normal mood and affect. His behavior is normal.    ED Course  Procedures (including critical care time)  DIAGNOSTIC STUDIES: Oxygen Saturation is 98% on room air, normal by my interpretation.    COORDINATION OF CARE: 6:51 PM -Will order heme occult and abdominal CT. Patient verbalizes understanding and agrees with treatment plan.    Labs Review Labs Reviewed  CBC WITH DIFFERENTIAL - Abnormal; Notable for the following:    WBC 11.5 (*)    Monocytes Relative 13 (*)    Monocytes Absolute 1.5 (*)    All other components within normal limits  COMPREHENSIVE METABOLIC PANEL - Abnormal; Notable for the following:    GFR calc non Af Amer 80 (*)    All other components within normal limits  LIPASE, BLOOD  URINALYSIS, ROUTINE W REFLEX MICROSCOPIC  OCCULT BLOOD X 1 CARD TO LAB, STOOL   Imaging Review Ct Abdomen Pelvis W Contrast  06/22/2013   CLINICAL DATA:  Intermittent abdominal pain that began 4 days ago  EXAM: CT ABDOMEN AND PELVIS WITH CONTRAST  TECHNIQUE: Multidetector CT imaging of the abdomen and pelvis was performed using the standard protocol following bolus administration of intravenous  contrast.  CONTRAST:  50mL OMNIPAQUE IOHEXOL 300 MG/ML SOLN, 100mL OMNIPAQUE IOHEXOL 300 MG/ML SOLN  COMPARISON:  DG ABDOMEN 1V dated 06/22/2013; CT ANGIO CHEST W/CM &/OR WO/CM dated 02/05/2009  FINDINGS: Lung bases are clear.  No pericardial fluid.  No focal hepatic lesion. The gallbladder, pancreas, spleen, adrenal glands, and kidneys are normal.  The stomach, small bowel, and cecum are normal.  Appendix is normal.  At the junction of the descending colon and proximal sigmoid colon there is extensive pericolonic fat inflammation. This is through s region of diverticular disease and is most consistent with acute diverticulitis. There is no pericolonic abscess. No evidence of bowel obstruction. More distal sigmoid colon rectum are normal.  Abdominal or is normal caliber. No retroperitoneal periportal lymphadenopathy. No mesenteric adenopathy. No aggressive osseous lesion. Examination of the pelvis is limited by the streak artifact from the hip prosthetics. Prostate gland and bladder grossly normal.  IMPRESSION: 1. Acute diverticulitis  of the proximal sigmoid colon. 2. No evidence of abscess formation. 3. Recommend followup imaging or colonoscopy to exclude underlying neoplasm.   Electronically Signed   By: Genevive Bi M.D.   On: 06/22/2013 20:17     EKG Interpretation None      MDM   Final diagnoses:  Diverticulitis    63 y.o. male who presents with abdominal pain. Patient sent from urgent care do to elevated white blood cell count. Hemoccult negative here. Mildly elevated white blood cell count of 11.5. CT consistent with diverticulitis without complication. Patient's pain well controlled. Will treat as outpatient.    I have discussed the diagnosis/risks/treatment options with the patient and family and believe the pt to be eligible for discharge home to follow-up with pcp as needed. We also discussed returning to the ED immediately if new or worsening sx occur. We discussed the sx which are most  concerning (e.g., inability to tol abx, uncontrolled pain) that necessitate immediate return. Medications administered to the patient during their visit and any new prescriptions provided to the patient are listed below.  Medications given during this visit Medications  HYDROmorphone (DILAUDID) injection 1 mg (1 mg Intravenous Given 06/22/13 1905)  iohexol (OMNIPAQUE) 300 MG/ML solution 50 mL (50 mLs Oral Contrast Given 06/22/13 1910)  iohexol (OMNIPAQUE) 300 MG/ML solution 100 mL (100 mLs Intravenous Contrast Given 06/22/13 1949)  ciprofloxacin (CIPRO) tablet 500 mg (500 mg Oral Given 06/22/13 2032)  metroNIDAZOLE (FLAGYL) tablet 500 mg (500 mg Oral Given 06/22/13 2032)    Discharge Medication List as of 06/22/2013  9:01 PM    START taking these medications   Details  ciprofloxacin (CIPRO) 500 MG tablet Take 1 tablet (500 mg total) by mouth 2 (two) times daily., Starting 06/22/2013, Until Discontinued, Print    metroNIDAZOLE (FLAGYL) 500 MG tablet Take 1 tablet (500 mg total) by mouth 3 (three) times daily., Starting 06/22/2013, Until Discontinued, Print    oxyCODONE-acetaminophen (PERCOCET) 5-325 MG per tablet Take 1-2 tablets by mouth every 6 (six) hours as needed for moderate pain., Starting 06/22/2013, Until Discontinued, Print    polyethylene glycol (MIRALAX / GLYCOLAX) packet Take 17 g by mouth daily., Starting 06/22/2013, Until Discontinued, Print          I personally performed the services described in this documentation, which was scribed in my presence. The recorded information has been reviewed and is accurate.    Junius Argyle, MD 06/23/13 [phone]

## 2013-06-22 NOTE — Discharge Instructions (Signed)
Diverticulitis °A diverticulum is a small pouch or sac on the colon. Diverticulosis is the presence of these diverticula on the colon. Diverticulitis is the irritation (inflammation) or infection of diverticula. °CAUSES  °The colon and its diverticula contain bacteria. If food particles block the tiny opening to a diverticulum, the bacteria inside can grow and cause an increase in pressure. This leads to infection and inflammation and is called diverticulitis. °SYMPTOMS  °· Abdominal pain and tenderness. Usually, the pain is located on the left side of your abdomen. However, it could be located elsewhere. °· Fever. °· Bloating. °· Feeling sick to your stomach (nausea). °· Throwing up (vomiting). °· Abnormal stools. °DIAGNOSIS  °Your caregiver will take a history and perform a physical exam. Since many things can cause abdominal pain, other tests may be necessary. Tests may include: °· Blood tests. °· Urine tests. °· X-ray of the abdomen. °· CT scan of the abdomen. °Sometimes, surgery is needed to determine if diverticulitis or other conditions are causing your symptoms. °TREATMENT  °Most of the time, you can be treated without surgery. Treatment includes: °· Resting the bowels by only having liquids for a few days. As you improve, you will need to eat a low-fiber diet. °· Intravenous (IV) fluids if you are losing body fluids (dehydrated). °· Antibiotic medicines that treat infections may be given. °· Pain and nausea medicine, if needed. °· Surgery if the inflamed diverticulum has burst. °HOME CARE INSTRUCTIONS  °· Try a clear liquid diet (broth, tea, or water for as long as directed by your caregiver). You may then gradually begin a low-fiber diet as tolerated.  °A low-fiber diet is a diet with less than 10 grams of fiber. Choose the foods below to reduce fiber in the diet: °· White breads, cereals, rice, and pasta. °· Cooked fruits and vegetables or soft fresh fruits and vegetables without the skin. °· Ground or  well-cooked tender beef, ham, veal, lamb, pork, or poultry. °· Eggs and seafood. °· After your diverticulitis symptoms have improved, your caregiver may put you on a high-fiber diet. A high-fiber diet includes 14 grams of fiber for every 1000 calories consumed. For a standard 2000 calorie diet, you would need 28 grams of fiber. Follow these diet guidelines to help you increase the fiber in your diet. It is important to slowly increase the amount fiber in your diet to avoid gas, constipation, and bloating. °· Choose whole-grain breads, cereals, pasta, and Carmean rice. °· Choose fresh fruits and vegetables with the skin on. Do not overcook vegetables because the more vegetables are cooked, the more fiber is lost. °· Choose more nuts, seeds, legumes, dried peas, beans, and lentils. °· Look for food products that have greater than 3 grams of fiber per serving on the Nutrition Facts label. °· Take all medicine as directed by your caregiver. °· If your caregiver has given you a follow-up appointment, it is very important that you go. Not going could result in lasting (chronic) or permanent injury, pain, and disability. If there is any problem keeping the appointment, call to reschedule. °SEEK MEDICAL CARE IF:  °· Your pain does not improve. °· You have a hard time advancing your diet beyond clear liquids. °· Your bowel movements do not return to normal. °SEEK IMMEDIATE MEDICAL CARE IF:  °· Your pain becomes worse. °· You have an oral temperature above 102° F (38.9° C), not controlled by medicine. °· You have repeated vomiting. °· You have bloody or black, tarry stools. °·   Symptoms that brought you to your caregiver become worse or are not getting better. °MAKE SURE YOU:  °· Understand these instructions. °· Will watch your condition. °· Will get help right away if you are not doing well or get worse. °Document Released: 12/30/2004 Document Revised: 06/14/2011 Document Reviewed: 04/27/2010 °[employer]® Patient Information  ©2014 [employer]. ° °

## 2013-06-22 NOTE — ED Notes (Signed)
Abdominal pain since Monday.  Seen [facility], had labs, abdominal XR and urinalysis.  Sent to ED for further evaluation.

## 2015-02-07 IMAGING — CT CT ABD-PELV W/ CM
2 of 5 series · 16 of 46 positions shown, 18 images · IV contrast (APPLIED)
Comparison: DG ABDOMEN 1V dated 06/22/2013; CT ANGIO CHEST W/CM
&/OR WO/CM dated 02/05/2009

CLINICAL DATA: Intermittent abdominal pain that began 4 days ago

EXAM:
CT ABDOMEN AND PELVIS WITH CONTRAST
TECHNIQUE: Multidetector CT imaging of the abdomen and pelvis was performed
using the standard protocol following bolus administration of
intravenous contrast.
CONTRAST:  50mL OMNIPAQUE IOHEXOL 300 MG/ML SOLN, 100mL OMNIPAQUE
IOHEXOL 300 MG/ML SOLN

[Series 2: abd/pelvis 5.0 b31f · axial · 0.81mm/px · z∈[-671,-246]mm · 13 of 96 slices shown, 15 images]
[im 6/96  soft-tissue]
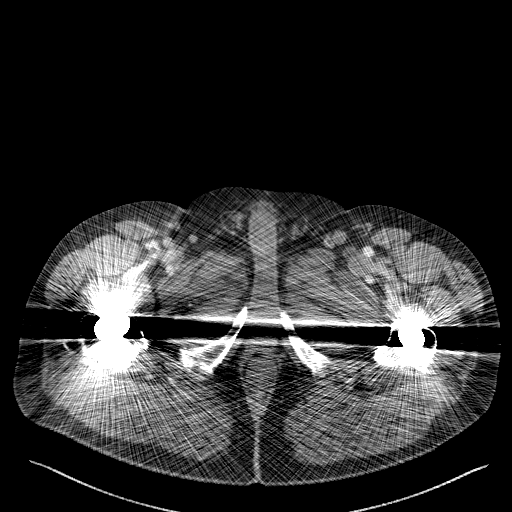
[im 6/96  bone]
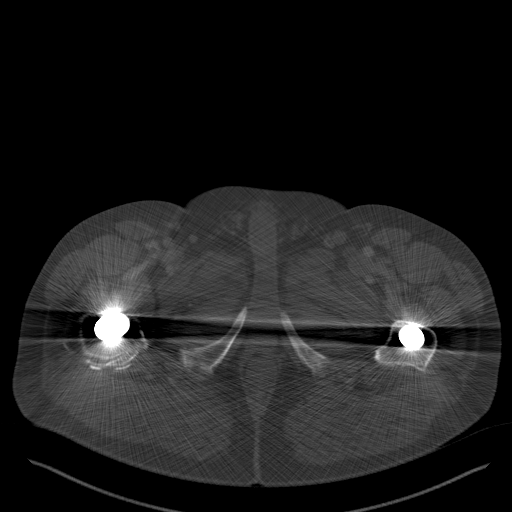
[im 11/96  soft-tissue]
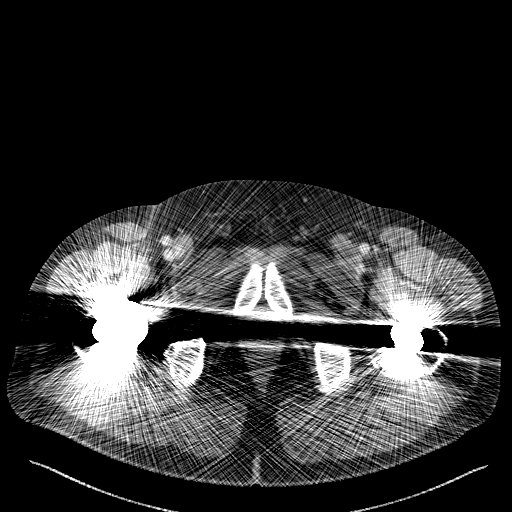
[im 26/96  soft-tissue]
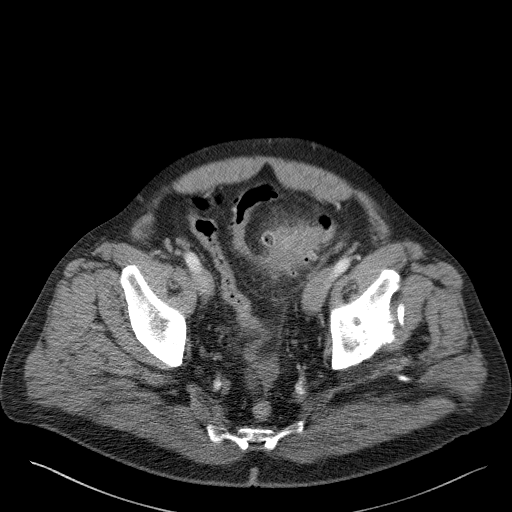
[im 31/96  soft-tissue]
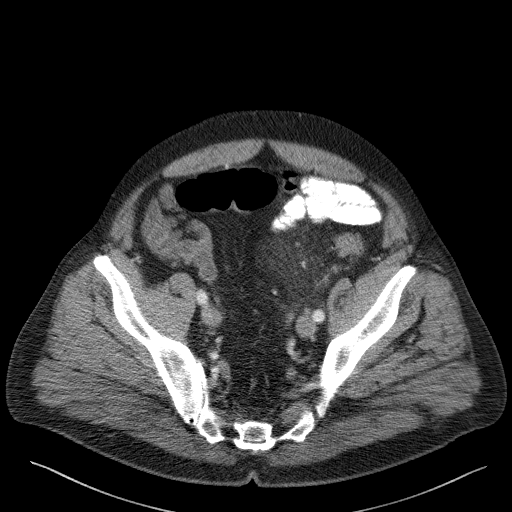
[im 36/96  soft-tissue]
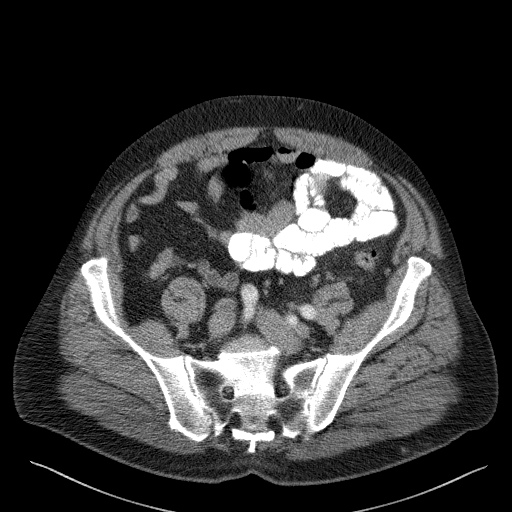
[im 46/96  soft-tissue]
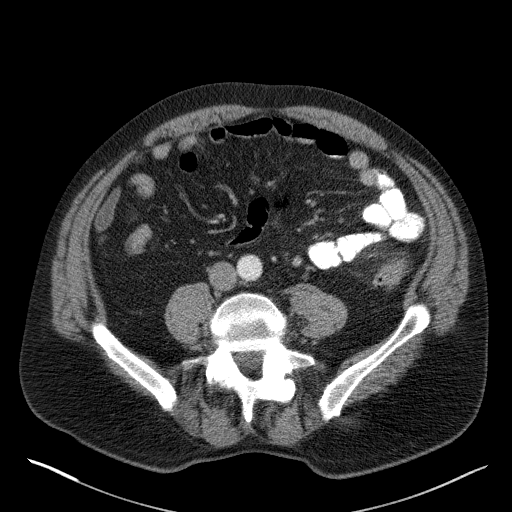
[im 51/96  soft-tissue]
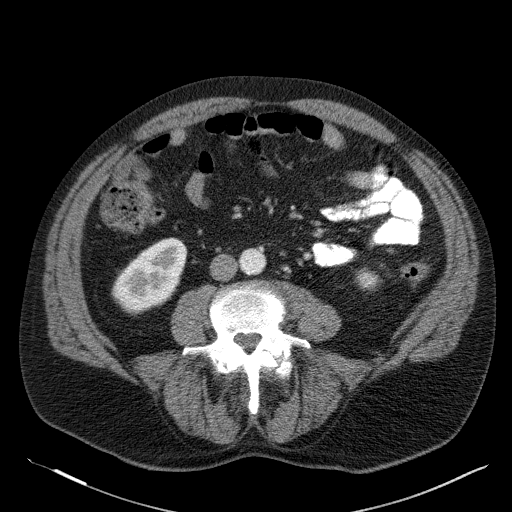
[im 56/96  soft-tissue]
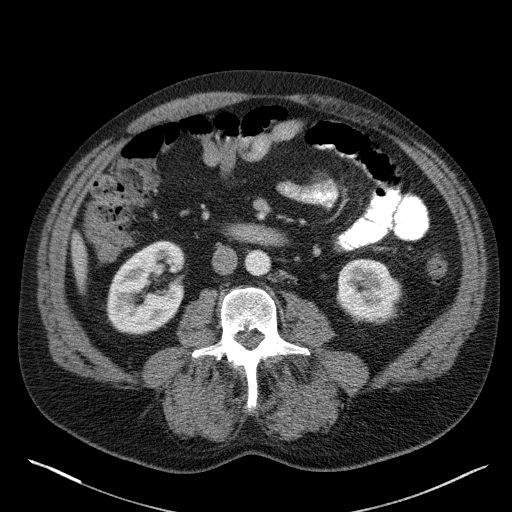
[im 66/96  soft-tissue]
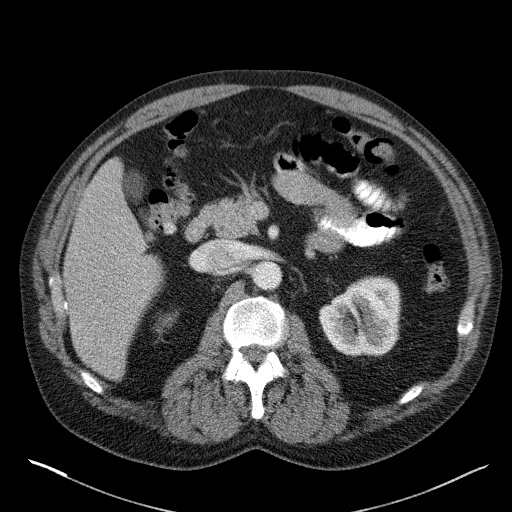
[im 66/96  bone]
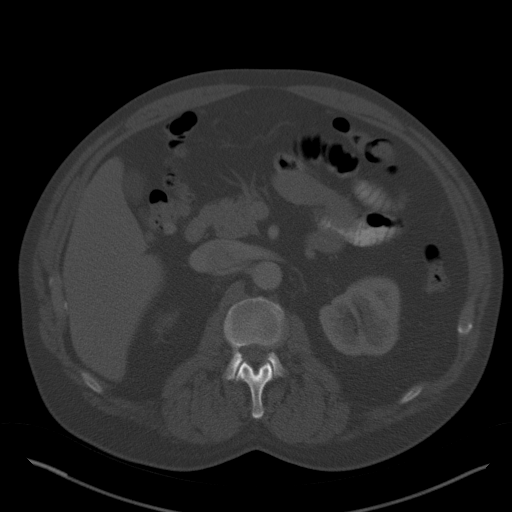
[im 71/96  soft-tissue]
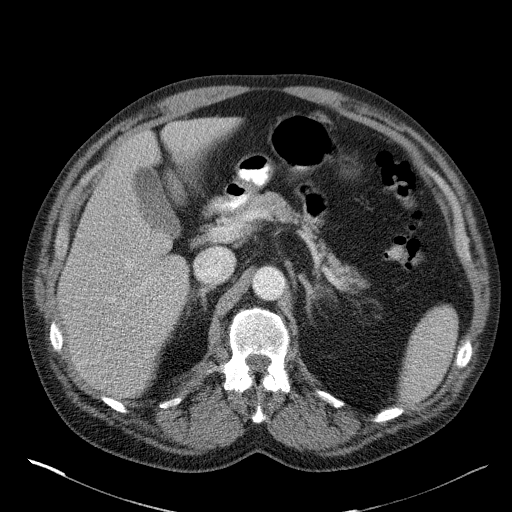
[im 76/96  soft-tissue]
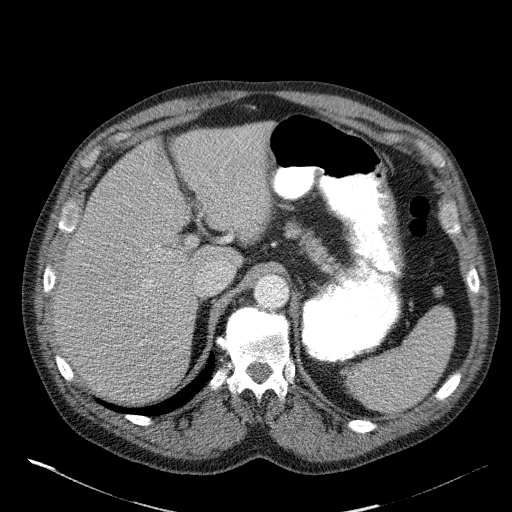
[im 86/96  soft-tissue]
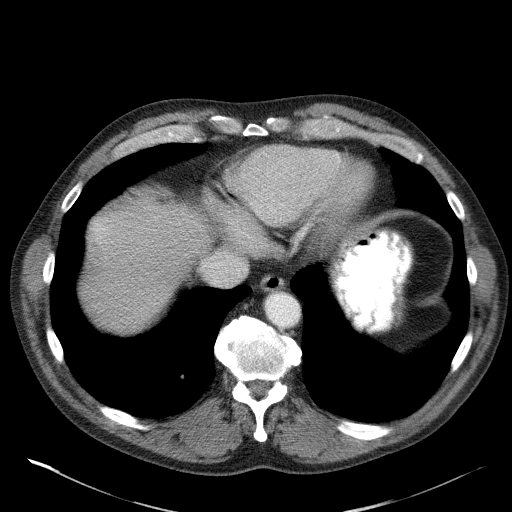
[im 91/96  soft-tissue]
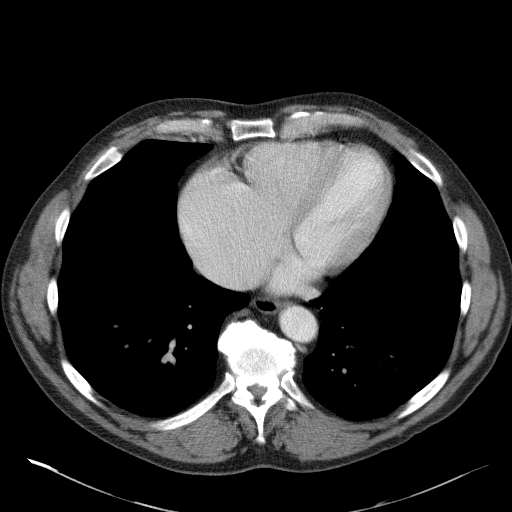

[Series 5: abd/pelvis 3.0 coronal · coronal · 0.87mm/px · 3 of 106 slices shown]
[im 36/106  soft-tissue]
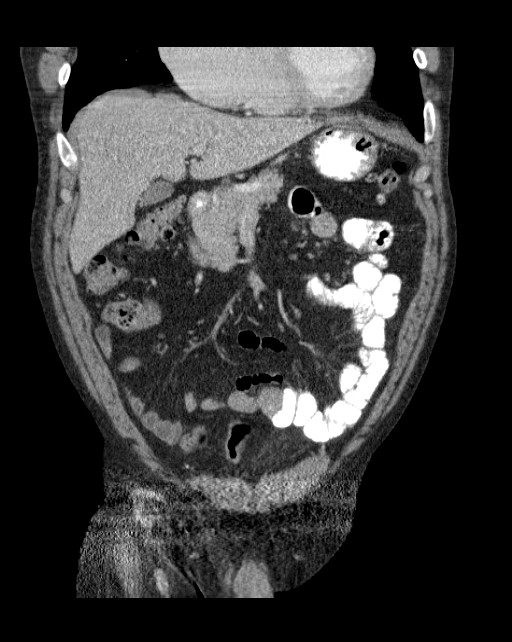
[im 47/106  soft-tissue]
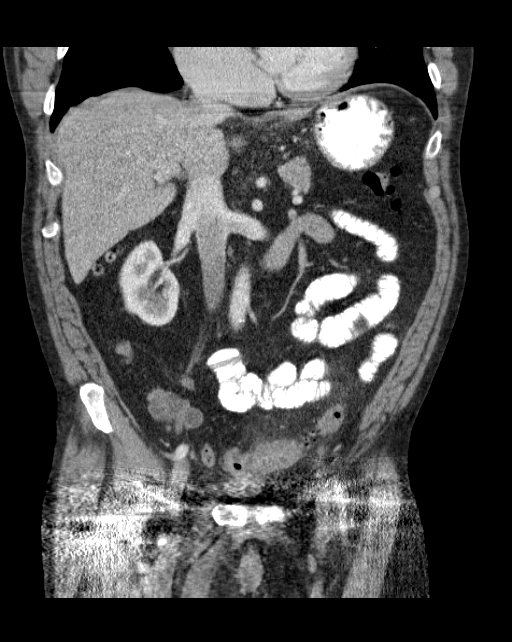
[im 59/106  soft-tissue]
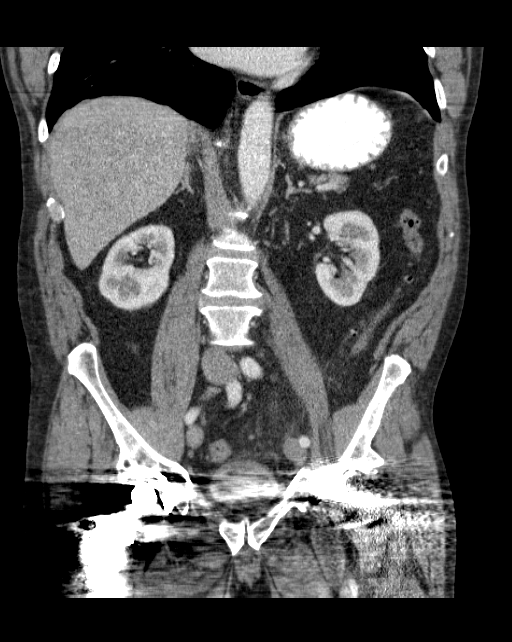

[16 of 46 positions shown; findings below may reference images not displayed]

FINDINGS: Lung bases are clear.  No pericardial fluid.

No focal hepatic lesion. The gallbladder, pancreas, spleen, adrenal
glands, and kidneys are normal.

The stomach, small bowel, and cecum are normal.  Appendix is normal.

At the junction of the descending colon and proximal sigmoid colon
there is extensive pericolonic fat inflammation. This is through s
region of diverticular disease and is most consistent with acute
diverticulitis. There is no pericolonic abscess. No evidence of
bowel obstruction. More distal sigmoid colon rectum are normal.

Abdominal or is normal caliber. No retroperitoneal periportal
lymphadenopathy. No mesenteric adenopathy. No aggressive osseous
lesion. Examination of the pelvis is limited by the streak artifact
from the hip prosthetics. Prostate gland and bladder grossly normal.
IMPRESSION: 1. Acute diverticulitis of the proximal sigmoid colon.
2. No evidence of abscess formation.
3. Recommend followup imaging or colonoscopy to exclude underlying
neoplasm.

## 2018-07-04 ENCOUNTER — Emergency Department (HOSPITAL_COMMUNITY): Payer: BLUE CROSS/BLUE SHIELD

## 2018-07-04 ENCOUNTER — Emergency Department (HOSPITAL_COMMUNITY)
Admission: EM | Admit: 2018-07-04 | Discharge: 2018-07-05 | Disposition: A | Discharge status: Home / Self Care | Payer: BLUE CROSS/BLUE SHIELD | Attending: Emergency Medicine | Admitting: Emergency Medicine

## 2018-07-04 ENCOUNTER — Encounter (HOSPITAL_COMMUNITY): Payer: Self-pay | Admitting: Emergency Medicine

## 2018-07-04 ENCOUNTER — Other Ambulatory Visit: Payer: Self-pay

## 2018-07-04 DIAGNOSIS — I10 Essential (primary) hypertension: Secondary | ICD-10-CM | POA: Diagnosis not present

## 2018-07-04 DIAGNOSIS — M25551 Pain in right hip: Secondary | ICD-10-CM

## 2018-07-04 DIAGNOSIS — T84020A Dislocation of internal right hip prosthesis, initial encounter: Secondary | ICD-10-CM | POA: Diagnosis not present

## 2018-07-04 DIAGNOSIS — Y828 Other medical devices associated with adverse incidents: Secondary | ICD-10-CM | POA: Insufficient documentation

## 2018-07-04 DIAGNOSIS — Z79899 Other long term (current) drug therapy: Secondary | ICD-10-CM | POA: Insufficient documentation

## 2018-07-04 DIAGNOSIS — S73004A Unspecified dislocation of right hip, initial encounter: Secondary | ICD-10-CM

## 2018-07-04 MED ORDER — PROPOFOL 10 MG/ML IV BOLUS
INTRAVENOUS | Status: AC | PRN
  Administered 2018-07-04: 60 mg via INTRAVENOUS
  Administered 2018-07-04 (×2): 20 mg via INTRAVENOUS

## 2018-07-04 MED ORDER — PROPOFOL 10 MG/ML IV BOLUS
1.0000 mg/kg | Freq: Once | INTRAVENOUS | Status: AC
  Administered 2018-07-04: 100 mg via INTRAVENOUS
  Filled 2018-07-04: qty 20

## 2018-07-04 MED ORDER — MORPHINE SULFATE (PF) 4 MG/ML IV SOLN
4.0000 mg | Freq: Once | INTRAVENOUS | Status: AC
  Administered 2018-07-04: 4 mg via INTRAVENOUS
  Filled 2018-07-04: qty 1

## 2018-07-04 MED ORDER — SODIUM CHLORIDE 0.9 % IV SOLN
Freq: Once | INTRAVENOUS | Status: AC
  Administered 2018-07-04: 23:00:00 via INTRAVENOUS

## 2018-07-04 NOTE — ED Provider Notes (Signed)
[facility]-EMERGENCY DEPT Provider Note   CSN: [id number] Arrival date & time: 07/04/18  2104    History   Chief Complaint Chief Complaint  Patient presents with  . Hip Pain    right    HPI Colin [name] is a 68 y.o. male.     The history is provided by the patient and medical records. No language interpreter was used.  Hip Pain  This is a recurrent problem. The current episode started less than 1 hour ago. The problem occurs rarely. The problem has not changed since onset.Pertinent negatives include no chest pain, no abdominal pain, no headaches and no shortness of breath. Nothing aggravates the symptoms. Nothing relieves the symptoms. He has tried nothing for the symptoms. The treatment provided no relief.    Past Medical History:  Diagnosis Date  . Atrial fibrillation (HCC)   . Constipation   . Gout   . Hypertension     Patient Active Problem List   Diagnosis Date Noted  . DYSPNEA 02/05/2009  . DYSPNEA ON EXERTION 02/05/2009  . ESSENTIAL HYPERTENSION, BENIGN 08/09/2008  . FIBRILLATION, ATRIAL 08/09/2008  . ATRIAL ENLARGEMENT, LEFT 08/09/2008    Past Surgical History:  Procedure Laterality Date  . APPENDECTOMY    . HIP SURGERY          Home Medications    Prior to Admission medications   Medication Sig Start Date End Date Taking? Authorizing Provider  ALLOPURINOL PO Take by mouth.    [provider]  ciprofloxacin (CIPRO) 500 MG tablet Take 1 tablet (500 mg total) by mouth 2 (two) times daily. 06/22/13   Purvis Sheffield, MD  metroNIDAZOLE (FLAGYL) 500 MG tablet Take 1 tablet (500 mg total) by mouth 3 (three) times daily. 06/22/13   Purvis Sheffield, MD  oxyCODONE-acetaminophen (PERCOCET) 5-325 MG per tablet Take 1-2 tablets by mouth every 6 (six) hours as needed for moderate pain. 06/22/13   Purvis Sheffield, MD  polyethylene glycol [facility] / Ethelene Hal) packet Take 17 g by mouth daily. 06/22/13   Purvis Sheffield, MD   UNKNOWN TO PATIENT     [provider]    Family History History reviewed. No pertinent family history.  Social History Social History   Tobacco Use  . Smoking status: Never Smoker  Substance Use Topics  . Alcohol use: Yes    Comment: 6 pack daily  . Drug use: No     Allergies   Patient has no known allergies.   Review of Systems Review of Systems  Constitutional: Negative for activity change, chills, diaphoresis, fatigue and fever.  HENT: Negative for congestion and rhinorrhea.   Eyes: Negative for visual disturbance.  Respiratory: Negative for cough, chest tightness, shortness of breath, wheezing and stridor.   Cardiovascular: Negative for chest pain, palpitations and leg swelling.  Gastrointestinal: Negative for abdominal distention, abdominal pain, blood in stool, constipation, diarrhea, nausea and vomiting.  Genitourinary: Negative for difficulty urinating, dysuria and flank pain.  Musculoskeletal: Negative for back pain and gait problem.  Skin: Negative for rash and wound.  Neurological: Negative for dizziness, weakness, light-headedness and headaches.  Psychiatric/Behavioral: Negative for agitation.  All other systems reviewed and are negative.    Physical Exam Updated Vital Signs BP (!) 128/98 (BP Location: Left Arm)   Pulse 66   Temp 98.2 F (36.8 C) (Oral)   Resp 10   Ht 6' (1.829 m)   Wt 102.1 kg   SpO2 98%   BMI 30.52 kg/m  Physical Exam Vitals signs and nursing note reviewed.  Constitutional:      Appearance: He is well-developed.  HENT:     Head: Normocephalic and atraumatic.     Nose: No congestion or rhinorrhea.     Mouth/Throat:     Pharynx: No oropharyngeal exudate or posterior oropharyngeal erythema.  Eyes:     Conjunctiva/sclera: Conjunctivae normal.     Pupils: Pupils are equal, round, and reactive to light.  Neck:     Musculoskeletal: Neck supple.  Cardiovascular:     Rate and Rhythm: Normal rate and regular rhythm.      Heart sounds: No murmur.  Pulmonary:     Effort: Pulmonary effort is normal. No respiratory distress.     Breath sounds: Normal breath sounds. No wheezing, rhonchi or rales.  Chest:     Chest wall: No tenderness.  Abdominal:     Palpations: Abdomen is soft.     Tenderness: There is no abdominal tenderness.  Musculoskeletal:        General: Tenderness and deformity present. No signs of injury.     Right hip: He exhibits decreased range of motion, tenderness, bony tenderness and deformity.     Right lower leg: No edema.     Left lower leg: No edema.       Legs:     Comments: Right hip tenderness and deformity.  Normal sensation, strength, and pulse in the ankle.  Postreduction reassessment had normal sensation, pulses, and appearance of the foot.  Normal strength in the ankle after reduction.  Knee immobilization in place.  Skin:    General: Skin is warm and dry.     Capillary Refill: Capillary refill takes less than 2 seconds.  Neurological:     General: No focal deficit present.     Mental Status: He is alert.      ED Treatments / Results  Labs (all labs ordered are listed, but only abnormal results are displayed) Labs Reviewed - No data to display  EKG None  Radiology Dg Hip Port Unilat W Or Wo Pelvis 1 View Right  Result Date: 07/04/2018 CLINICAL DATA:  Status post reduction of right hip EXAM: DG HIP (WITH OR WITHOUT PELVIS) 1V PORT RIGHT COMPARISON:  07/04/2018 FINDINGS: Interval reduction of right hip dislocation. No fractures identified. IMPRESSION: 1. Interval reduction of right hip. Electronically Signed   By: Signa Kell M.D.   On: 07/04/2018 23:14   Dg Hip Unilat W Or Wo Pelvis 2-3 Views Right  Result Date: 07/04/2018 CLINICAL DATA:  68 year old male status post right hip dislocation. EXAM: DG HIP (WITH OR WITHOUT PELVIS) 2-3V RIGHT COMPARISON:  12/19/2015 FINDINGS: There is a right total hip arthroplasty. There is been interval superior dislocation of the  right hip. No fracture identified. A left total hip arthroplasty device is also noted which appears located. IMPRESSION: 1. Superior dislocation of the right hip. Electronically Signed   By: Signa Kell M.D.   On: 07/04/2018 21:50    Procedures .Sedation Date/Time: 07/04/2018 11:53 PM Performed by: Heide Scales, MD Authorized by: Heide Scales, MD   Consent:    Consent obtained:  Verbal   Consent given by:  Patient   Risks discussed:  Allergic reaction, dysrhythmia, inadequate sedation, nausea, prolonged hypoxia resulting in organ damage, prolonged sedation necessitating reversal, respiratory compromise necessitating ventilatory assistance and intubation and vomiting   Alternatives discussed:  Analgesia without sedation, anxiolysis and regional anesthesia Universal protocol:    Procedure explained and questions  answered to patient or proxy's satisfaction: yes     Relevant documents present and verified: yes     Test results available and properly labeled: yes     Imaging studies available: yes     Required blood products, implants, devices, and special equipment available: yes     Site/side marked: yes     Immediately prior to procedure a time out was called: yes     Patient identity confirmation method:  Verbally with patient Indications:    Procedure necessitating sedation performed by:  Physician performing sedation Pre-sedation assessment:    Time since last food or drink:  Hours   ASA classification: class 1 - normal, healthy patient     Neck mobility: normal     Mouth opening:  3 or more finger widths   Thyromental distance:  4 finger widths   Mallampati score:  I - soft palate, uvula, fauces, pillars visible   Pre-sedation assessments completed and reviewed: airway patency, cardiovascular function, hydration status, mental status, nausea/vomiting, pain level, respiratory function and temperature   Immediate pre-procedure details:    Reassessment: Patient  reassessed immediately prior to procedure     Reviewed: vital signs, relevant labs/tests and NPO status     Verified: bag valve mask available, emergency equipment available, intubation equipment available, IV patency confirmed, oxygen available and suction available   Procedure details (see MAR for exact dosages):    Preoxygenation:  Nasal cannula   Sedation:  Propofol   Intra-procedure monitoring:  Blood pressure monitoring, cardiac monitor, continuous pulse oximetry, frequent LOC assessments, frequent vital sign checks and continuous capnometry   Intra-procedure events: none     Total Provider sedation time (minutes):  30 Post-procedure details:    Attendance: Constant attendance by certified staff until patient recovered     Recovery: Patient returned to pre-procedure baseline     Post-sedation assessments completed and reviewed: airway patency, cardiovascular function, hydration status, mental status, nausea/vomiting, pain level, respiratory function and temperature     Patient is stable for discharge or admission: yes     Patient tolerance:  Tolerated well, no immediate complications   (including critical care time)  Medications Ordered in ED Medications  morphine 4 MG/ML injection 4 mg (4 mg Intravenous Given 07/04/18 2227)  propofol (DIPRIVAN) 10 mg/mL bolus/IV push 102.1 mg (100 mg Intravenous Given by Other 07/04/18 2303)  0.9 %  sodium chloride infusion ( Intravenous New Bag/Given 07/04/18 2252)  propofol (DIPRIVAN) 10 mg/mL bolus/IV push (20 mg Intravenous Given 07/04/18 2257)     Initial Impression / Assessment and Plan / ED Course  I have reviewed the triage vital signs and the nursing notes.  Pertinent labs & imaging results that were available during my care of the patient were reviewed by me and considered in my medical decision making (see chart for details).        Colin [name] is a 68 y.o. 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  Patient ports restraints on the toilet this evening and felt a pop and had deformity of his right leg.  He reports this feels the exact same as his last hip dislocation several months ago.  He reports that typically he is able to get it reduced in the emergency department without difficulty.  He denies any preceding symptoms of fevers, chills, chest pain, shortness breath, nausea, vomiting, urinary  symptoms or GI symptoms.  He denies any allergies to medications.  He denies any trauma.  On exam, patient has a shortened and deformed right leg.  There is normal pulse, sensation, and strength in the ankle.  Tenderness present in the right hip and pain with any hip manipulation.  Pelvis otherwise nontender.  Lungs clear chest nontender.  Abdomen nontender.  Patient is pulses in all extremities.  X-ray was obtained showing right prosthetic hip dislocation.  No fracture seen.  Patient had right hip reduced with procedural sedation without difficulty.  Post reduction x-ray shows reduction.  Patient had a knee immobilizer in place and will follow-up with his orthopedist team.  Patient woke up physician without difficulty and had normal sensation, strength, and pulse in his leg distal to the reduction.  Patient with questions or concerns and will be discharged.            Final Clinical Impressions(s) / ED Diagnoses   Final diagnoses:  Dislocation of right hip, initial encounter [facility])  Right hip pain    ED Discharge Orders    None      Clinical Impression: 1. Dislocation of right hip, initial encounter (HCC)   2. Right hip pain     Disposition: Discharge  Condition: Good  I have discussed the results, Dx and Tx plan with the pt(& family if present). He/she/they expressed understanding and agree(s) with the plan. Discharge instructions discussed at great  length. Strict return precautions discussed and pt &/or family have verbalized understanding of the instructions. No further questions at time of discharge.    New Prescriptions   No medications on file    Follow Up: Ollen Gross, MD [address] [place] [place] [address] [phone]     [facility] [address] [id number] mc [address] [place] [address] [phone]       Eira Alpert, Canary Brim, MD 07/05/18 [phone]

## 2018-07-04 NOTE — ED Triage Notes (Addendum)
Pt arriving from home via [facility] for right hip dislocation. Pt states he was trying to sit on the toilet and felt his hip pop out. Pt reports this has happened multiple times. Pt is on Xarelto.

## 2018-07-04 NOTE — ED Notes (Signed)
Bed: HU83 Expected date:  Expected time:  Means of arrival:  Comments: 68 yr old hip dislocation

## 2018-07-04 NOTE — Discharge Instructions (Signed)
Your history and exam today were consistent with hip dislocation.  The x-ray confirmed this and then we had a successful hip reduction with procedural sedation.  As you woke up from your sedation without difficulty and your x-ray confirmed successful reduction, we feel you are safe for discharge home.  Please use the immobilizer to prevent it from re-dislocating and follow-up with the orthopedics team.  If any symptoms change or worsen, please return to the nearest emergency department.

## 2018-07-04 NOTE — ED Provider Notes (Signed)
  Physical Exam  BP 108/77   Pulse 72   Temp 98.2 F (36.8 C) (Oral)   Resp 13   Ht 6' (1.829 m)   Wt 102.1 kg   SpO2 98%   BMI 30.52 kg/m   Physical Exam  ED Course/Procedures     Dr. Rush Landmark performed the procedural sedation and was the primary physician for this patient.  I was able to assist with the hip reduction.  Reduction of dislocation Date/Time: 07/04/2018 11:33 PM Performed by: Derwood Kaplan, MD Authorized by: Derwood Kaplan, MD  Consent: Written consent obtained. Risks and benefits: risks, benefits and alternatives were discussed Consent given by: patient Patient understanding: patient states understanding of the procedure being performed Patient consent: the patient's understanding of the procedure matches consent given Procedure consent: procedure consent matches procedure scheduled Relevant documents: relevant documents present and verified Test results: test results available and properly labeled Site marked: the operative site was marked Imaging studies: imaging studies available Patient identity confirmed: arm band Time out: Immediately prior to procedure a "time out" was called to verify the correct patient, procedure, equipment, support staff and site/side marked as required. Local anesthesia used: no  Anesthesia: Local anesthesia used: no Patient tolerance: Patient tolerated the procedure well with no immediate complications     MDM  Hip was reduced successfully. Neurovascular intact.  X-ray confirms the reduction. Patient tolerated the procedure without any complications.       Derwood Kaplan, MD 07/04/18 [phone]

## 2020-02-19 IMAGING — CR DG HIP (WITH OR WITHOUT PELVIS) 2-3V RIGHT
3 series · 3 of 3 positions shown · non-contrast
Comparison: 12/19/2015

CLINICAL DATA: 67-year-old male status post right hip dislocation.

EXAM:
DG HIP (WITH OR WITHOUT PELVIS) 2-3V RIGHT

[x pelvis]
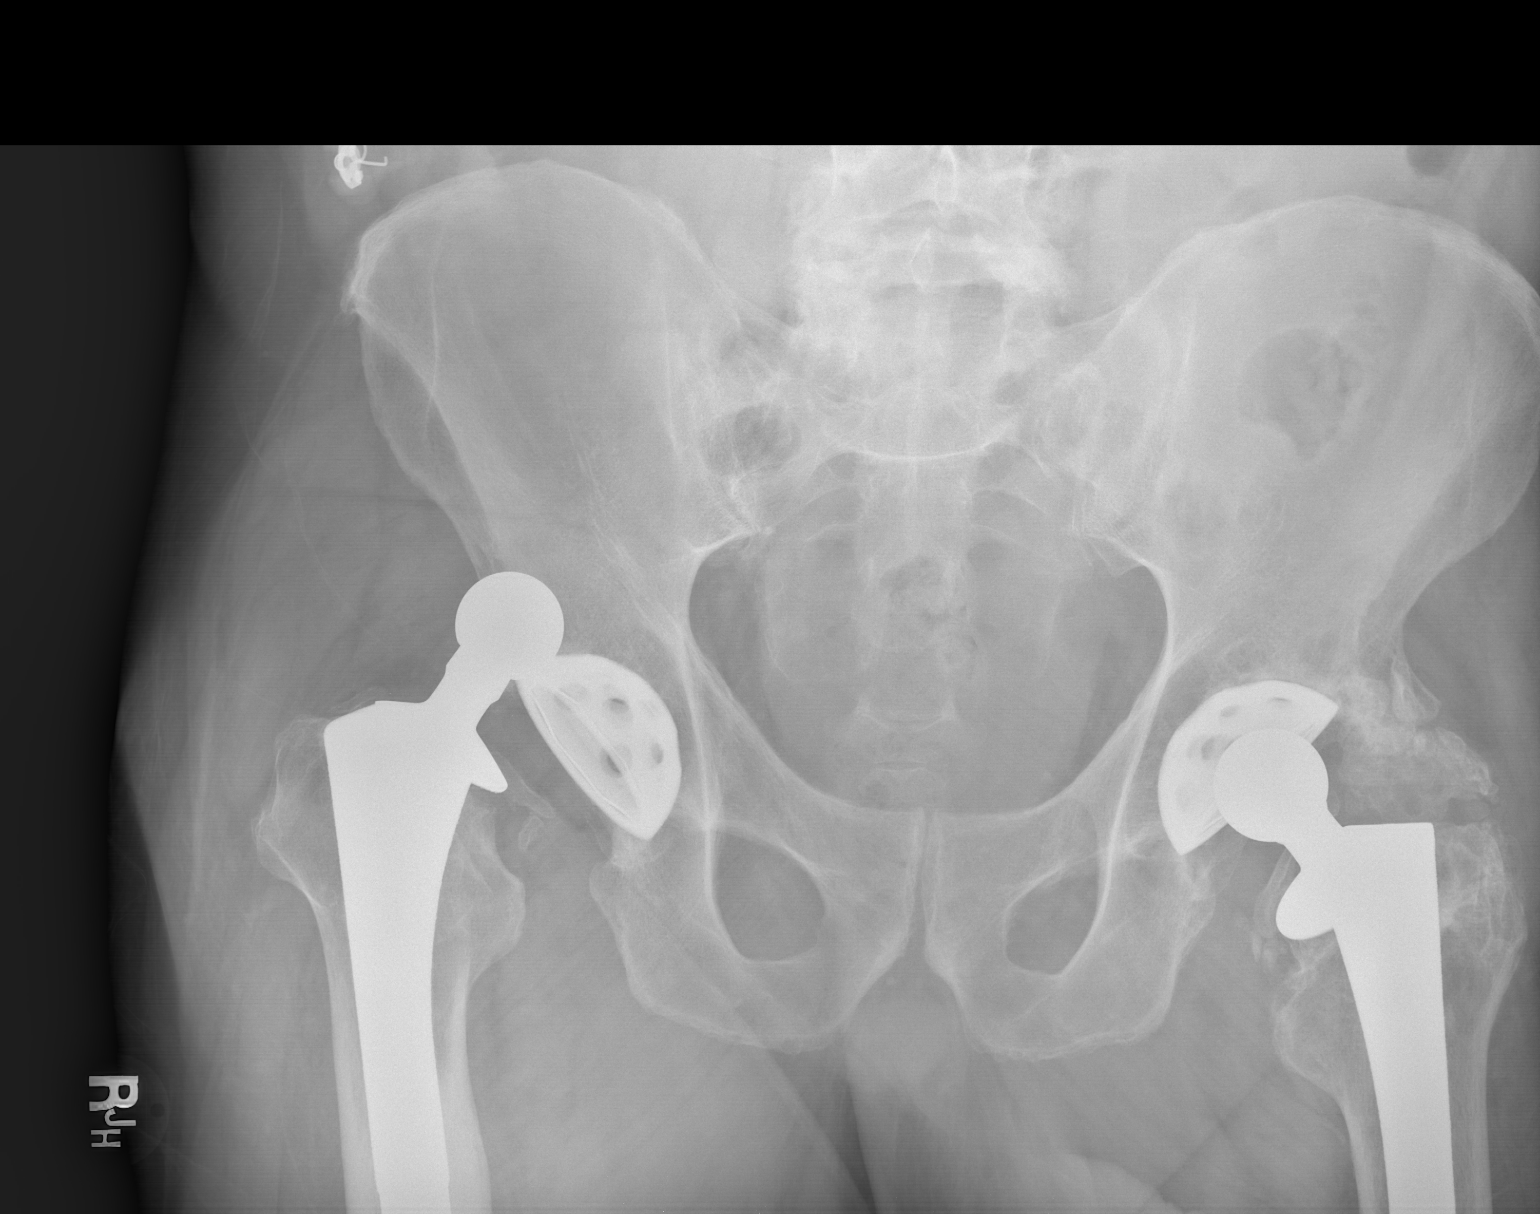

[x hip ap right]
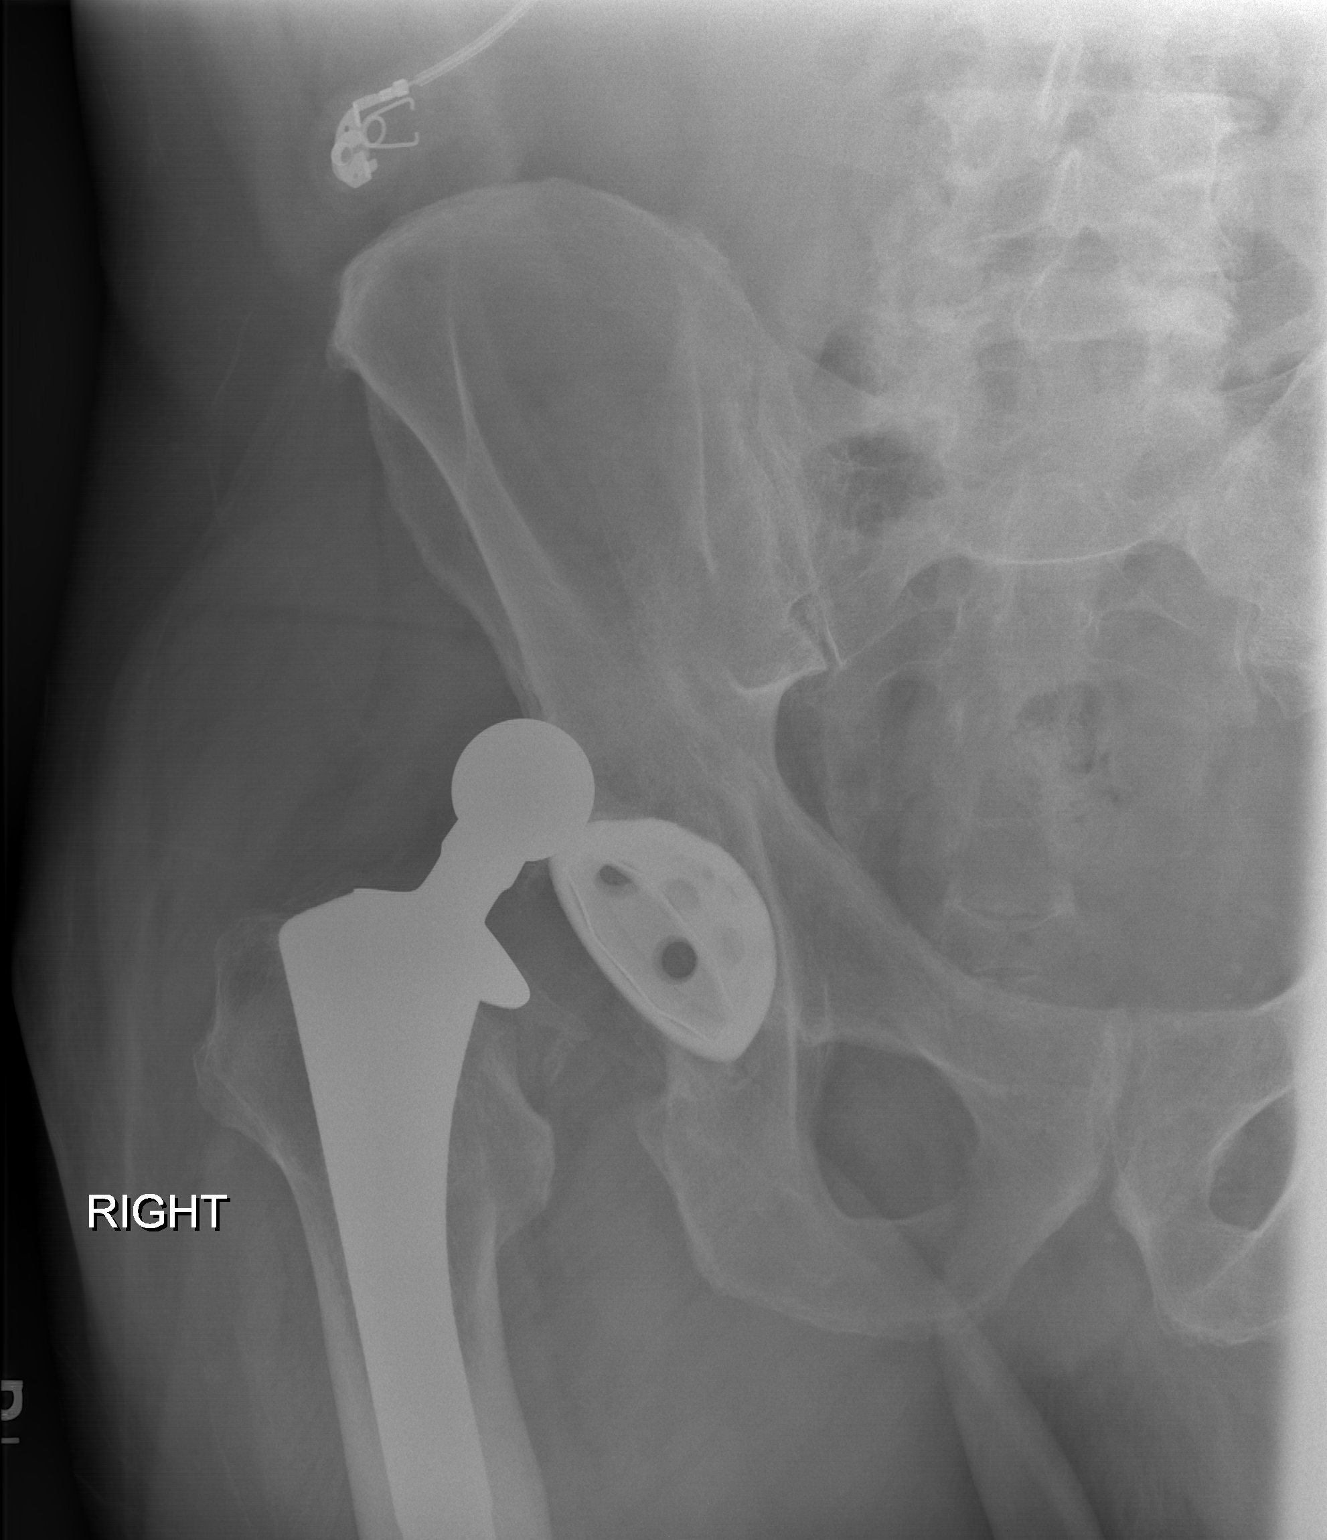

[w hip lat right]
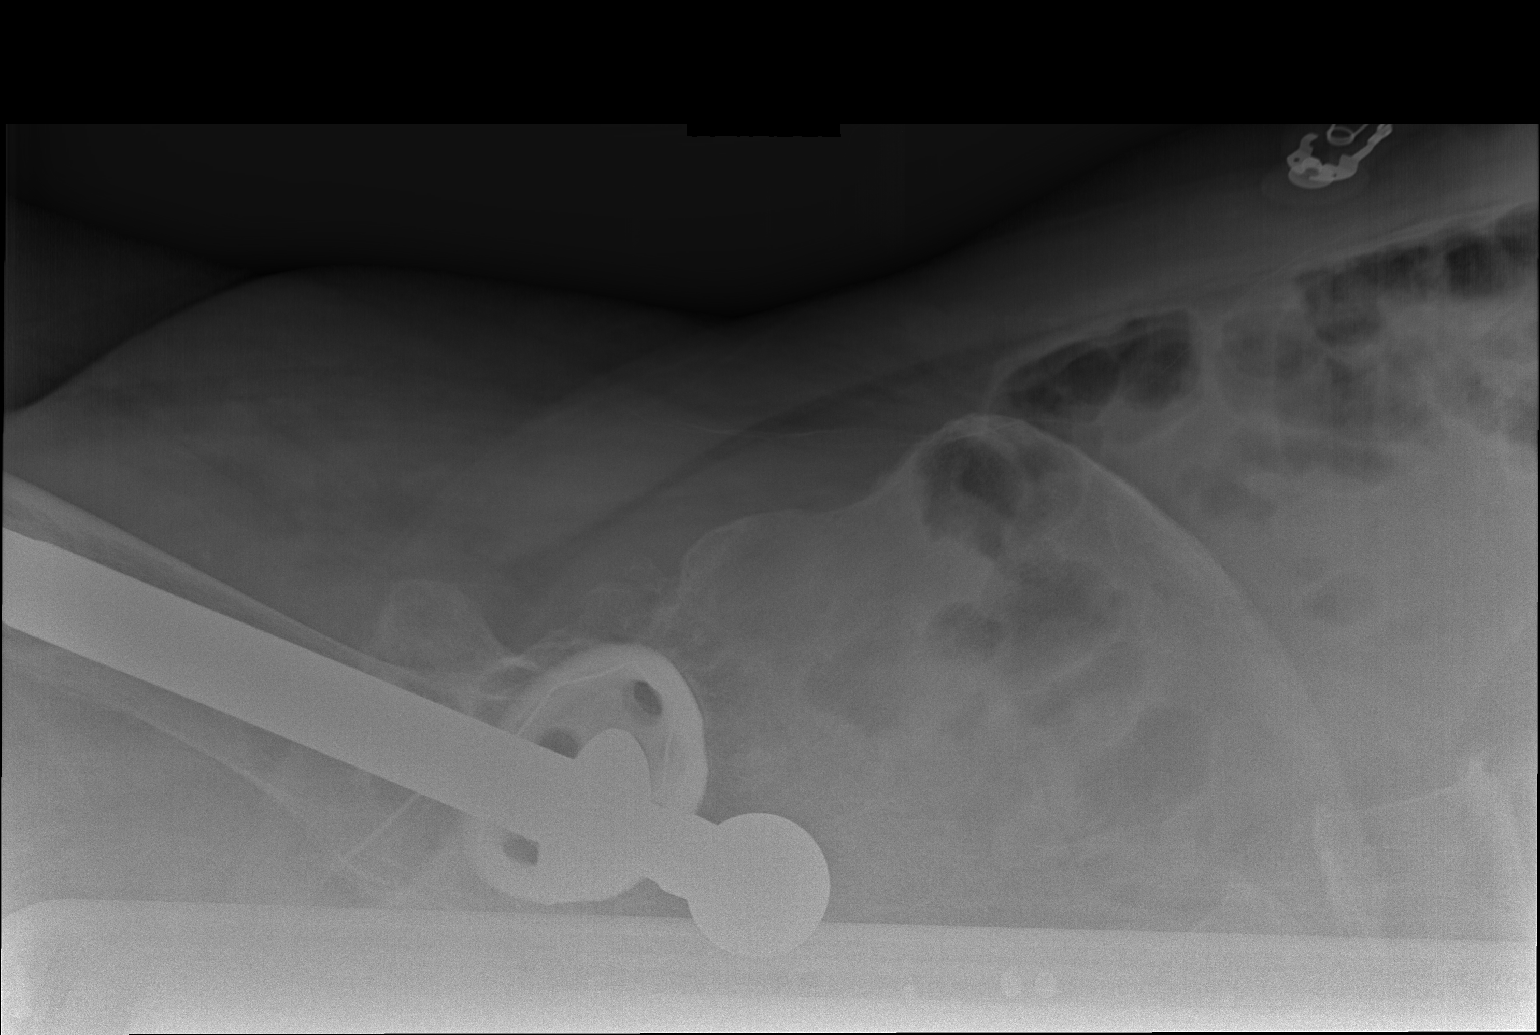

[3 of 3 positions shown; findings below may reference images not displayed]

FINDINGS: There is a right total hip arthroplasty. There is been interval
superior dislocation of the right hip. No fracture identified. A
left total hip arthroplasty device is also noted which appears
located.
IMPRESSION: 1. Superior dislocation of the right hip.

## 2023-07-05 DEATH — deceased
